# Patient Record
Sex: Female | Born: 1960 | Race: White | Hispanic: No | Smoking: Current every day smoker
Health system: Southern US, Community
[De-identification: ages and names within clinical notes are randomized; demographics above are authoritative.]

## PROBLEM LIST (undated history)

## (undated) DIAGNOSIS — I10 Essential (primary) hypertension: Secondary | ICD-10-CM

## (undated) DIAGNOSIS — I639 Cerebral infarction, unspecified: Secondary | ICD-10-CM

## (undated) DIAGNOSIS — E119 Type 2 diabetes mellitus without complications: Secondary | ICD-10-CM

---

## 2016-02-25 ENCOUNTER — Encounter (HOSPITAL_COMMUNITY): Payer: Self-pay | Admitting: *Deleted

## 2016-02-25 ENCOUNTER — Emergency Department (HOSPITAL_COMMUNITY)
Admission: EM | Admit: 2016-02-25 | Discharge: 2016-02-25 | Disposition: A | Discharge status: Home / Self Care | Payer: Medicaid - Out of State | Attending: Emergency Medicine | Admitting: Emergency Medicine

## 2016-02-25 ENCOUNTER — Emergency Department (HOSPITAL_COMMUNITY): Payer: Medicaid - Out of State

## 2016-02-25 DIAGNOSIS — I10 Essential (primary) hypertension: Secondary | ICD-10-CM | POA: Insufficient documentation

## 2016-02-25 DIAGNOSIS — E1165 Type 2 diabetes mellitus with hyperglycemia: Secondary | ICD-10-CM | POA: Insufficient documentation

## 2016-02-25 DIAGNOSIS — R109 Unspecified abdominal pain: Secondary | ICD-10-CM | POA: Diagnosis present

## 2016-02-25 DIAGNOSIS — F17210 Nicotine dependence, cigarettes, uncomplicated: Secondary | ICD-10-CM | POA: Diagnosis not present

## 2016-02-25 DIAGNOSIS — K5900 Constipation, unspecified: Secondary | ICD-10-CM | POA: Diagnosis not present

## 2016-02-25 DIAGNOSIS — R739 Hyperglycemia, unspecified: Secondary | ICD-10-CM

## 2016-02-25 DIAGNOSIS — Z79899 Other long term (current) drug therapy: Secondary | ICD-10-CM | POA: Insufficient documentation

## 2016-02-25 DIAGNOSIS — Z7984 Long term (current) use of oral hypoglycemic drugs: Secondary | ICD-10-CM | POA: Insufficient documentation

## 2016-02-25 HISTORY — DX: Essential (primary) hypertension: I10

## 2016-02-25 HISTORY — DX: Cerebral infarction, unspecified: I63.9

## 2016-02-25 HISTORY — DX: Type 2 diabetes mellitus without complications: E11.9

## 2016-02-25 LAB — COMPREHENSIVE METABOLIC PANEL
ALK PHOS: 68 U/L (ref 38–126)
ALT: 20 U/L (ref 14–54)
AST: 21 U/L (ref 15–41)
Albumin: 4.2 g/dL (ref 3.5–5.0)
Anion gap: 6 (ref 5–15)
BUN: 9 mg/dL (ref 6–20)
CALCIUM: 9 mg/dL (ref 8.9–10.3)
CO2: 25 mmol/L (ref 22–32)
CREATININE: 0.57 mg/dL (ref 0.44–1.00)
Chloride: 104 mmol/L (ref 101–111)
GFR calc non Af Amer: >60 mL/min (ref >60)
GLUCOSE: 272 mg/dL — AB (ref 65–99)
Potassium: 3.9 mmol/L (ref 3.5–5.1)
SODIUM: 135 mmol/L (ref 135–145)
Total Bilirubin: 0.6 mg/dL (ref 0.3–1.2)
Total Protein: 7.3 g/dL (ref 6.5–8.1)

## 2016-02-25 LAB — CBC
HCT: 39.7 % (ref 36.0–46.0)
Hemoglobin: 13.7 g/dL (ref 12.0–15.0)
MCH: 31 pg (ref 26.0–34.0)
MCHC: 34.5 g/dL (ref 30.0–36.0)
MCV: 89.8 fL (ref 78.0–100.0)
PLATELETS: 198 10*3/uL (ref 150–400)
RBC: 4.42 MIL/uL (ref 3.87–5.11)
RDW: 13.1 % (ref 11.5–15.5)
WBC: 10.1 10*3/uL (ref 4.0–10.5)

## 2016-02-25 LAB — URINALYSIS, ROUTINE W REFLEX MICROSCOPIC
BILIRUBIN URINE: NEGATIVE
Glucose, UA: 100 mg/dL — AB
Hgb urine dipstick: NEGATIVE
KETONES UR: NEGATIVE mg/dL
Leukocytes, UA: NEGATIVE
NITRITE: NEGATIVE
PH: 5.5 (ref 5.0–8.0)
PROTEIN: NEGATIVE mg/dL
Specific Gravity, Urine: 1.025 (ref 1.005–1.030)

## 2016-02-25 LAB — TROPONIN I: Troponin I: <0.03 ng/mL (ref <0.03)

## 2016-02-25 LAB — LIPASE, BLOOD: Lipase: 32 U/L (ref 11–51)

## 2016-02-25 LAB — D-DIMER, QUANTITATIVE (NOT AT ARMC): D DIMER QUANT: 0.31 ug{FEU}/mL (ref 0.00–0.50)

## 2016-02-25 MED ORDER — HYDROMORPHONE HCL 1 MG/ML IJ SOLN
0.5000 mg | Freq: Once | INTRAMUSCULAR | Status: AC
Start: 2016-02-25 — End: 2016-02-25
  Administered 2016-02-25: 0.5 mg via INTRAVENOUS
  Filled 2016-02-25: qty 1

## 2016-02-25 MED ORDER — KETOROLAC TROMETHAMINE 30 MG/ML IJ SOLN
15.0000 mg | Freq: Once | INTRAMUSCULAR | Status: AC
  Administered 2016-02-25: 15 mg via INTRAVENOUS
  Filled 2016-02-25: qty 1

## 2016-02-25 MED ORDER — ONDANSETRON HCL 4 MG/2ML IJ SOLN
4.0000 mg | Freq: Once | INTRAMUSCULAR | Status: AC
  Administered 2016-02-25: 4 mg via INTRAVENOUS
  Filled 2016-02-25: qty 2

## 2016-02-25 MED ORDER — HYDROCODONE-ACETAMINOPHEN 5-325 MG PO TABS
1.0000 | ORAL_TABLET | Freq: Once | ORAL | Status: AC
  Administered 2016-02-25: 1 via ORAL
  Filled 2016-02-25: qty 1

## 2016-02-25 MED ORDER — IOPAMIDOL (ISOVUE-300) INJECTION 61%
100.0000 mL | Freq: Once | INTRAVENOUS | Status: AC | PRN
  Administered 2016-02-25: 100 mL via INTRAVENOUS

## 2016-02-25 MED ORDER — POLYETHYLENE GLYCOL 3350 17 G PO PACK: 17.0000 g | PACK | Freq: Every day | ORAL | 0 refills | Status: DC

## 2016-02-25 MED ORDER — DIATRIZOATE MEGLUMINE & SODIUM 66-10 % PO SOLN
ORAL | Status: AC
  Filled 2016-02-25: qty 30

## 2016-02-25 MED ORDER — HYDROCODONE-ACETAMINOPHEN 5-325 MG PO TABS: 1.0000 | ORAL_TABLET | ORAL | 0 refills | Status: DC | PRN

## 2016-02-25 MED ORDER — HYDROMORPHONE HCL 1 MG/ML IJ SOLN
0.5000 mg | Freq: Once | INTRAMUSCULAR | Status: AC
  Administered 2016-02-25: 0.5 mg via INTRAVENOUS
  Filled 2016-02-25: qty 1

## 2016-02-25 NOTE — ED Notes (Signed)
Pt returned from xray. Nad.  

## 2016-02-25 NOTE — ED Notes (Signed)
CT drinking contrast given to pt. nad

## 2016-02-25 NOTE — ED Triage Notes (Signed)
Pt comes in for left side pain. Pt states this pain has been going on for over 2 months and that [facility] with no answer to her pain. Pt denies any n/v/d, denies any urinary symptoms.

## 2016-02-25 NOTE — ED Provider Notes (Signed)
[facility]-EMERGENCY DEPT Provider Note   CSN: [id number] Arrival date & time: 02/25/16  1529  First Provider Contact:  First MD Initiated Contact with Patient 02/25/16 1607        History   Chief Complaint Chief Complaint  Patient presents with  . Flank Pain    HPI Kim Boyd is a 55 y.o. female.  Kim Boyd is a 55 y.o. 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  Associated symptoms include increasing constipation with increased flatulence despite using stool softeners at her pcp's suggestion.  She endorses nausea without emesis, denies fevers, chills, chest pain, sob, dysuria, hematuria or bloody stools.  She has been seen both at [facility] and in [place] (reports had a negative CT scan there about 3 weeks ago) all without diagnosis.  She endorses prior history of heavy etoh use, not currently.  She has developed a new constant pain between her shoulder blades this week which is worsened with deep inspiration.  She reports chronic smokers cough, not new or different and denies sob.  She has found no alleviators for her symptoms.  She reports her pcp ([facility] [place]) wants her to get a colonoscopy which has been scheduled at [facility] but pt denies ability to get transportation there.     The history is provided by the patient and a relative.    Past Medical History:  Diagnosis Date  . Diabetes mellitus without complication (HCC)   . Hypertension   . Stroke [facility])    April 2017    There are no active problems to display for this patient.   Past Surgical History:  Procedure Laterality Date  . CESAREAN SECTION      OB History    No data available       Home Medications    Prior to Admission medications   Medication Sig  Start Date End Date Taking? Authorizing Provider  LANTUS 100 UNIT/ML injection Inject 20 Units into the skin daily. 01/14/16  Yes Historical Provider, MD  ciprofloxacin (CIPRO) 500 MG tablet Take 500 mg by mouth 2 (two) times daily. Starting 01/28/2016 x 10 days for pain in side.    Historical Provider, MD  HYDROcodone-acetaminophen (NORCO/VICODIN) 5-325 MG tablet Take 1 tablet by mouth every 4 (four) hours as needed. 02/25/16   Burgess Amor, PA-C  polyethylene glycol (MIRALAX / GLYCOLAX) packet Take 17 g by mouth daily. 02/25/16   Burgess Amor, PA-C    Family History No family history on file.  Social History Social History  Substance Use Topics  . Smoking status: Current Every Day Smoker    Packs/day: 0.50    Types: Cigarettes  . Smokeless tobacco: Never Used  . Alcohol use Yes     Comment: occ.      Allergies   Review of patient's allergies indicates no known allergies.   Review of Systems Review of Systems  Constitutional: Negative for fever.  HENT: Negative for congestion and sore throat.   Eyes: Negative.   Respiratory: Positive for cough. Negative for chest tightness and shortness of breath.        Baseline smokers cough without change  Cardiovascular: Negative for chest pain.  Gastrointestinal: Positive for abdominal pain, constipation and nausea. Negative for blood in stool and vomiting.  Genitourinary: Negative.  Negative for hematuria.  Musculoskeletal: Positive for back pain. Negative for arthralgias, joint swelling and neck pain.  Skin: Negative.  Negative for rash and wound.  Neurological: Negative for dizziness, weakness, light-headedness, numbness and headaches.  Psychiatric/Behavioral: Negative.      Physical Exam Updated Vital Signs BP 161/78 (BP Location: Right Arm)   Pulse 68   Temp 98 F (36.7 C) (Oral)   Resp 18   Ht 5\' 1"  (1.549 m)   Wt 56.7 kg   SpO2 97%   BMI 23.62 kg/m   Physical Exam  Constitutional: She appears well-developed and  well-nourished.  HENT:  Head: Normocephalic and atraumatic.  Eyes: Conjunctivae are normal.  Neck: Normal range of motion.  Cardiovascular: Normal rate, regular rhythm, normal heart sounds and intact distal pulses.   Pulmonary/Chest: Effort normal and breath sounds normal. She has no wheezes. She exhibits no tenderness.  Abdominal: Soft. Bowel sounds are normal. She exhibits no distension and no mass. There is no hepatosplenomegaly. There is tenderness. There is CVA tenderness. There is no rigidity, no rebound and no guarding. No hernia.  ttp left lateral mid abdomen and left cva tenderness. Positive left cva ttp.  Musculoskeletal: Normal range of motion.  Neurological: She is alert.  Skin: Skin is warm and dry.  Psychiatric: She has a normal mood and affect.  Nursing note and vitals reviewed.    ED Treatments / Results  Labs (all labs ordered are listed, but only abnormal results are displayed) Labs Reviewed  URINALYSIS, ROUTINE W REFLEX MICROSCOPIC (NOT AT [facility]) - Abnormal; Notable for the following:       Result Value   Glucose, UA 100 (*)    All other components within normal limits  COMPREHENSIVE METABOLIC PANEL - Abnormal; Notable for the following:    Glucose, Bld 272 (*)    All other components within normal limits  LIPASE, BLOOD  CBC  D-DIMER, QUANTITATIVE (NOT AT [facility])  TROPONIN I    EKG  EKG Interpretation  Date/Time:  Wednesday February 25 2016 16:36:31 EDT Ventricular Rate:  65 PR Interval:  176 QRS Duration: 90 QT Interval:  410 QTC Calculation: 426 R Axis:   86 Text Interpretation:  Normal sinus rhythm Normal ECG Confirmed by HAVILAND MD, Mikolaj Woolstenhulme ([id number]) on 02/25/2016 4:43:00 PM       Radiology Ct Abdomen Pelvis W Contrast  Result Date: 02/25/2016 CLINICAL DATA:  55 year old female with left abdominal and pelvic pain, nausea and constipation for 2 months. EXAM: CT ABDOMEN AND PELVIS WITH CONTRAST TECHNIQUE: Multidetector CT imaging of the abdomen and pelvis  was performed using the standard protocol following bolus administration of intravenous contrast. CONTRAST:  100mL ISOVUE-300 IOPAMIDOL (ISOVUE-300) INJECTION 61% COMPARISON:  None. FINDINGS: Lower chest:  Unremarkable Hepatobiliary: The liver and gallbladder are unremarkable. There is no evidence of biliary dilatation. Pancreas: A 1.5 cm dense calcification within the body of the pancreas noted with atrophy of the pancreatic body and tail. Spleen: Unremarkable Adrenals/Urinary Tract: The kidneys, adrenal glands and bladder are unremarkable. Stomach/Bowel: The appendix is upper limits of normal in size with equivocal mild adjacent stranding. No other bowel abnormalities are identified. There is no evidence of bowel obstruction or definite bowel wall thickening otherwise. Vascular/Lymphatic: Abdominal aortic atherosclerotic calcifications noted without aneurysm. No enlarged lymph nodes identified. Reproductive: Unremarkable Other: No free fluid, pneumoperitoneum or abscess. Musculoskeletal: No acute or suspicious abnormality. IMPRESSION: Upper limits normal sized appendix with equivocal mild adjacent stranding. Correlate clinically as early/mild appendicitis is not  entirely excluded. Abdominal aortic atherosclerosis. Benign-appearing calcification within the body of the pancreas with atrophic pancreatic tail. Electronically Signed   By: Harmon Pier M.D.   On: 02/25/2016 19:54   Dg Abd Acute W/chest  Result Date: 02/25/2016 CLINICAL DATA:  Pt comes in for left side pain. Pt states this pain has been going on for over 2 months and that she has been evaluated at [facility] and [facility] with no answer to her pain. Pt denies any n/v/d, denies any urinary symptoms. EXAM: DG ABDOMEN ACUTE W/ 1V CHEST COMPARISON:  None. FINDINGS: Cardiomediastinal silhouette is within normal limits. The lungs are free of focal consolidations and pleural effusions. No free intraperitoneal air. Supine and erect views show  moderate stool burden throughout nondilated loops of colon. There is mild prominence of small bowel loops in the left upper quadrant. No evidence for bowel obstruction, organomegaly. Small phleboliths are identified within the pelvis. IMPRESSION: 1.  No evidence for acute cardiopulmonary abnormality. 2. Moderate stool burden. 3. Mildly prominent left upper quadrant small bowel loops possibly indicating focal ileus. Electronically Signed   By: Norva Pavlov M.D.   On: 02/25/2016 17:06    Procedures Procedures (including critical care time)  Medications Ordered in ED Medications  ondansetron (ZOFRAN) injection 4 mg (4 mg Intravenous Given 02/25/16 1644)  ketorolac (TORADOL) 30 MG/ML injection 15 mg (15 mg Intravenous Given 02/25/16 1644)  HYDROmorphone (DILAUDID) injection 0.5 mg (0.5 mg Intravenous Given 02/25/16 1730)  HYDROmorphone (DILAUDID) injection 0.5 mg (0.5 mg Intravenous Given 02/25/16 1915)  iopamidol (ISOVUE-300) 61 % injection 100 mL (100 mLs Intravenous Contrast Given 02/25/16 1932)  HYDROcodone-acetaminophen (NORCO/VICODIN) 5-325 MG per tablet 1 tablet (1 tablet Oral Given 02/25/16 2156)     Initial Impression / Assessment and Plan / ED Course  I have reviewed the triage vital signs and the nursing notes.  Pertinent labs & imaging results that were available during my care of the patient were reviewed by me and considered in my medical decision making (see chart for details).  Clinical Course    Records from [facility] ED obtained and reviewed including CT scan - renal stone protocol and negative. Labs and CT imaging reviewed.  Pt re-examined with no rlq pain. Her pain has been chronic for 2+ months and very atypical for appendicitis despite CT findings.  She does have significant constipation and was prescribed miralax.  Discussed case with Dr. Manus Gunning who also saw pt and with Dr. Lovell Sheehan who agrees to see pt in office in close f/u.  Pt advised to call his office for appt time.  She  and dg agree with plan.  She was given script for a few hydrocodone tabs but cautioned to use sparingly as these can increase constipation.   Final Clinical Impressions(s) / ED Diagnoses   Final diagnoses:  Flank pain  Hyperglycemia  Constipation, unspecified constipation type    New Prescriptions Discharge Medication List as of 02/25/2016  9:39 PM    START taking these medications   Details  HYDROcodone-acetaminophen (NORCO/VICODIN) 5-325 MG tablet Take 1 tablet by mouth every 4 (four) hours as needed., Starting Wed 02/25/2016, Print    polyethylene glycol (MIRALAX / GLYCOLAX) packet Take 17 g by mouth daily., Starting Wed 02/25/2016, Print         Burgess Amor, PA-C 02/26/16 1311    Glynn Octave, MD 02/26/16 [phone]

## 2016-02-25 NOTE — Discharge Instructions (Signed)
You may take the hydrocodone prescribed for pain relief.  This will make you drowsy - do not drive within 4 hours of taking this medication.  Also it can make you more constipated so use sparingly.  Add the miralax powder daily to help resolve your constipation.

## 2020-06-16 ENCOUNTER — Emergency Department (HOSPITAL_COMMUNITY): Payer: 59

## 2020-06-16 ENCOUNTER — Encounter (HOSPITAL_COMMUNITY): Payer: Self-pay | Admitting: Emergency Medicine

## 2020-06-16 ENCOUNTER — Other Ambulatory Visit: Payer: Self-pay

## 2020-06-16 ENCOUNTER — Inpatient Hospital Stay (HOSPITAL_COMMUNITY)
Admission: EM | Admit: 2020-06-16 | Discharge: 2020-06-19 | DRG: 917 | Disposition: A | Discharge status: Home Health | Payer: 59 | Attending: Internal Medicine | Admitting: Internal Medicine

## 2020-06-16 DIAGNOSIS — I6930 Unspecified sequelae of cerebral infarction: Secondary | ICD-10-CM

## 2020-06-16 DIAGNOSIS — Z20822 Contact with and (suspected) exposure to covid-19: Secondary | ICD-10-CM | POA: Diagnosis present

## 2020-06-16 DIAGNOSIS — I63212 Cerebral infarction due to unspecified occlusion or stenosis of left vertebral arteries: Secondary | ICD-10-CM | POA: Diagnosis present

## 2020-06-16 DIAGNOSIS — Z79899 Other long term (current) drug therapy: Secondary | ICD-10-CM

## 2020-06-16 DIAGNOSIS — T405X1A Poisoning by cocaine, accidental (unintentional), initial encounter: Principal | ICD-10-CM | POA: Diagnosis present

## 2020-06-16 DIAGNOSIS — Z8673 Personal history of transient ischemic attack (TIA), and cerebral infarction without residual deficits: Secondary | ICD-10-CM

## 2020-06-16 DIAGNOSIS — Z809 Family history of malignant neoplasm, unspecified: Secondary | ICD-10-CM

## 2020-06-16 DIAGNOSIS — G8321 Monoplegia of upper limb affecting right dominant side: Secondary | ICD-10-CM | POA: Diagnosis present

## 2020-06-16 DIAGNOSIS — F17210 Nicotine dependence, cigarettes, uncomplicated: Secondary | ICD-10-CM | POA: Diagnosis present

## 2020-06-16 DIAGNOSIS — F1910 Other psychoactive substance abuse, uncomplicated: Secondary | ICD-10-CM

## 2020-06-16 DIAGNOSIS — E1165 Type 2 diabetes mellitus with hyperglycemia: Secondary | ICD-10-CM

## 2020-06-16 DIAGNOSIS — R200 Anesthesia of skin: Secondary | ICD-10-CM | POA: Diagnosis not present

## 2020-06-16 DIAGNOSIS — R7309 Other abnormal glucose: Secondary | ICD-10-CM

## 2020-06-16 DIAGNOSIS — I10 Essential (primary) hypertension: Secondary | ICD-10-CM | POA: Diagnosis present

## 2020-06-16 DIAGNOSIS — Z7982 Long term (current) use of aspirin: Secondary | ICD-10-CM

## 2020-06-16 DIAGNOSIS — E785 Hyperlipidemia, unspecified: Secondary | ICD-10-CM | POA: Diagnosis present

## 2020-06-16 DIAGNOSIS — R29701 NIHSS score 1: Secondary | ICD-10-CM | POA: Diagnosis present

## 2020-06-16 DIAGNOSIS — I639 Cerebral infarction, unspecified: Secondary | ICD-10-CM | POA: Diagnosis present

## 2020-06-16 DIAGNOSIS — I6381 Other cerebral infarction due to occlusion or stenosis of small artery: Secondary | ICD-10-CM | POA: Diagnosis present

## 2020-06-16 DIAGNOSIS — R739 Hyperglycemia, unspecified: Secondary | ICD-10-CM

## 2020-06-16 DIAGNOSIS — E1142 Type 2 diabetes mellitus with diabetic polyneuropathy: Secondary | ICD-10-CM | POA: Diagnosis present

## 2020-06-16 DIAGNOSIS — Z794 Long term (current) use of insulin: Secondary | ICD-10-CM

## 2020-06-16 LAB — I-STAT CHEM 8, ED
BUN: 17 mg/dL (ref 6–20)
Calcium, Ion: 1.22 mmol/L (ref 1.15–1.40)
Chloride: 99 mmol/L (ref 98–111)
Creatinine, Ser: 0.6 mg/dL (ref 0.44–1.00)
Glucose, Bld: 445 mg/dL — ABNORMAL HIGH (ref 70–99)
HCT: 48 % — ABNORMAL HIGH (ref 36.0–46.0)
Hemoglobin: 16.3 g/dL — ABNORMAL HIGH (ref 12.0–15.0)
Potassium: 3.9 mmol/L (ref 3.5–5.1)
Sodium: 136 mmol/L (ref 135–145)
TCO2: 25 mmol/L (ref 22–32)

## 2020-06-16 LAB — COMPREHENSIVE METABOLIC PANEL
ALT: 18 U/L (ref 0–44)
AST: 25 U/L (ref 15–41)
Albumin: 3.7 g/dL (ref 3.5–5.0)
Alkaline Phosphatase: 77 U/L (ref 38–126)
Anion gap: 12 (ref 5–15)
BUN: 14 mg/dL (ref 6–20)
CO2: 20 mmol/L — ABNORMAL LOW (ref 22–32)
Calcium: 9.2 mg/dL (ref 8.9–10.3)
Chloride: 99 mmol/L (ref 98–111)
Creatinine, Ser: 0.94 mg/dL (ref 0.44–1.00)
GFR, Estimated: >60 mL/min (ref >60)
Glucose, Bld: 530 mg/dL (ref 70–99)
Potassium: 3.9 mmol/L (ref 3.5–5.1)
Sodium: 131 mmol/L — ABNORMAL LOW (ref 135–145)
Total Bilirubin: 0.5 mg/dL (ref 0.3–1.2)
Total Protein: 6.6 g/dL (ref 6.5–8.1)

## 2020-06-16 LAB — CBC WITH DIFFERENTIAL/PLATELET
Abs Immature Granulocytes: 0.03 10*3/uL (ref 0.00–0.07)
Basophils Absolute: 0.1 10*3/uL (ref 0.0–0.1)
Basophils Relative: 1 %
Eosinophils Absolute: 0.1 10*3/uL (ref 0.0–0.5)
Eosinophils Relative: 1 %
HCT: 45.1 % (ref 36.0–46.0)
Hemoglobin: 14.7 g/dL (ref 12.0–15.0)
Immature Granulocytes: 0 %
Lymphocytes Relative: 19 %
Lymphs Abs: 1.8 10*3/uL (ref 0.7–4.0)
MCH: 29.6 pg (ref 26.0–34.0)
MCHC: 32.6 g/dL (ref 30.0–36.0)
MCV: 90.7 fL (ref 80.0–100.0)
Monocytes Absolute: 0.4 10*3/uL (ref 0.1–1.0)
Monocytes Relative: 4 %
Neutro Abs: 7.3 10*3/uL (ref 1.7–7.7)
Neutrophils Relative %: 75 %
Platelets: 213 10*3/uL (ref 150–400)
RBC: 4.97 MIL/uL (ref 3.87–5.11)
RDW: 13.3 % (ref 11.5–15.5)
WBC: 9.7 10*3/uL (ref 4.0–10.5)
nRBC: 0 % (ref 0.0–0.2)

## 2020-06-16 LAB — APTT: aPTT: 26 seconds (ref 24–36)

## 2020-06-16 LAB — RESP PANEL BY RT-PCR (FLU A&B, COVID) ARPGX2
Influenza A by PCR: NEGATIVE
Influenza B by PCR: NEGATIVE
SARS Coronavirus 2 by RT PCR: NEGATIVE

## 2020-06-16 LAB — I-STAT BETA HCG BLOOD, ED (MC, WL, AP ONLY): I-stat hCG, quantitative: 13.9 m[IU]/mL — ABNORMAL HIGH (ref <5)

## 2020-06-16 LAB — PROTIME-INR
INR: 1 (ref 0.8–1.2)
Prothrombin Time: 12.3 seconds (ref 11.4–15.2)

## 2020-06-16 MED ORDER — GADOBUTROL 1 MMOL/ML IV SOLN
5.5000 mL | Freq: Once | INTRAVENOUS | Status: AC | PRN
  Administered 2020-06-16: 5.5 mL via INTRAVENOUS

## 2020-06-16 MED ORDER — SODIUM CHLORIDE 0.9 % IV SOLN
100.0000 mL/h | INTRAVENOUS | Status: DC
  Administered 2020-06-16: 100 mL/h via INTRAVENOUS

## 2020-06-16 MED ORDER — INSULIN ASPART 100 UNIT/ML ~~LOC~~ SOLN
10.0000 [IU] | Freq: Once | SUBCUTANEOUS | Status: AC
  Administered 2020-06-16: 10 [IU] via INTRAVENOUS

## 2020-06-16 MED ORDER — SODIUM CHLORIDE 0.9 % IV BOLUS
500.0000 mL | Freq: Once | INTRAVENOUS | Status: AC
  Administered 2020-06-16: 500 mL via INTRAVENOUS

## 2020-06-16 NOTE — ED Notes (Signed)
Pt to mri 

## 2020-06-16 NOTE — ED Provider Notes (Signed)
[facility] EMERGENCY DEPARTMENT Provider Note   CSN: [id number] Arrival date & time: 06/16/20  1825     History Chief Complaint  Patient presents with  . Numbness    Kim Boyd is a 59 y.o. female.  HPI Patient presents with right-sided numbness.  Onset was yesterday, about 34 hours prior to ED arrival.  She notes history of hypertension, and cigarette addiction. However, she was in her usual state of health until yesterday.  Since onset yesterday, she has had persistent sense of numbness throughout her right upper and lower extremities.  Less severe symptoms are present in her face.  No difficulty swallowing, speaking, breathing. Mild discoordination on the right. Since onset, no clear alleviating or exacerbating factors. It is unclear why she waited until today for evaluation.   Past Medical History:  Diagnosis Date  . Diabetes mellitus without complication (HCC)   . Hypertension   . Stroke [facility])    April 2017    There are no problems to display for this patient.   Past Surgical History:  Procedure Laterality Date  . CESAREAN SECTION       OB History   No obstetric history on file.     No family history on file.  Social History   Tobacco Use  . Smoking status: Current Every Day Smoker    Packs/day: 0.50    Types: Cigarettes  . Smokeless tobacco: Never Used  Substance Use Topics  . Alcohol use: Yes    Comment: occ.   . Drug use: Yes    Comment: "street drugs" pt wouldnt' be more descriptive.     Home Medications Prior to Admission medications   Medication Sig Start Date End Date Taking? Authorizing Provider  amitriptyline (ELAVIL) 25 MG tablet Take 1 tablet by mouth daily. 02/29/20  Yes [provider]  aspirin 81 MG EC tablet Take 1 tablet by mouth daily.   Yes [provider]  gabapentin (NEURONTIN) 300 MG capsule Take 300 mg by mouth 3 (three) times daily. 04/30/20  Yes [provider]    gabapentin (NEURONTIN) 600 MG tablet Take 600 mg by mouth 3 (three) times daily. 01/11/20  Yes [provider]  glipiZIDE (GLUCOTROL) 5 MG tablet Take 5 mg by mouth 2 (two) times daily. 05/22/20  Yes [provider]  hydrochlorothiazide (MICROZIDE) 12.5 MG capsule Take 12.5 mg by mouth daily. 05/07/20  Yes [provider]  LANTUS 100 UNIT/ML injection Inject 20 Units into the skin daily. 01/14/16  Yes [provider]  levocetirizine (XYZAL) 5 MG tablet Take 5 mg by mouth daily. 03/01/20  Yes [provider]  lisinopril (ZESTRIL) 20 MG tablet Take 20 mg by mouth daily. 05/07/20  Yes [provider]  rosuvastatin (CRESTOR) 10 MG tablet Take 10 mg by mouth daily. 04/30/20  Yes [provider]  JANUMET 50-1000 MG tablet Take 1 tablet by mouth 2 (two) times daily. Patient not taking: Reported on 06/16/2020 04/30/20   [provider]    Allergies    Other  Review of Systems   Review of Systems  Constitutional:       Per HPI, otherwise negative  HENT:       Per HPI, otherwise negative  Respiratory:       Per HPI, otherwise negative  Cardiovascular:       Per HPI, otherwise negative  Gastrointestinal: Negative for vomiting.  Endocrine:       Negative aside from HPI  Genitourinary:  Neg aside from HPI   Musculoskeletal:       Per HPI, otherwise negative  Skin: Negative.   Neurological: Positive for weakness and numbness. Negative for syncope.    Physical Exam Updated Vital Signs BP (!) 120/97 (BP Location: Right Arm)   Pulse 71   Temp 98.5 F (36.9 C) (Oral)   Resp (!) 22   Ht 5\' 1"  (1.549 m)   Wt 56.7 kg   SpO2 100%   BMI 23.62 kg/m   Physical Exam Vitals and nursing note reviewed.  Constitutional:      General: She is not in acute distress.    Appearance: She is well-developed.  HENT:     Head: Normocephalic and atraumatic.  Eyes:     Conjunctiva/sclera: Conjunctivae normal.  Cardiovascular:      Rate and Rhythm: Normal rate and regular rhythm.  Pulmonary:     Effort: Pulmonary effort is normal. No respiratory distress.     Breath sounds: Normal breath sounds. No stridor.  Abdominal:     General: There is no distension.  Skin:    General: Skin is warm and dry.  Neurological:     Mental Status: She is alert and oriented to person, place, and time.     Cranial Nerves: No cranial nerve deficit or dysarthria.     Motor: Weakness present.     Coordination: Coordination abnormal.     Comments: Dysmetria, right-sided. Right grip strength is 4+/5, left grip 5/5.  Otherwise neurological exam unremarkable, no facial asymmetry, speech is brief, clear.  Patient does describe subjective diminished sensation in the extremities right compared to left.      ED Results / Procedures / Treatments   Labs (all labs ordered are listed, but only abnormal results are displayed) Labs Reviewed  COMPREHENSIVE METABOLIC PANEL - Abnormal; Notable for the following components:      Result Value   Sodium 131 (*)    CO2 20 (*)    Glucose, Bld 530 (*)    All other components within normal limits  I-STAT CHEM 8, ED - Abnormal; Notable for the following components:   Glucose, Bld 445 (*)    Hemoglobin 16.3 (*)    HCT 48.0 (*)    All other components within normal limits  I-STAT BETA HCG BLOOD, ED ([facility], [facility], [facility] ONLY) - Abnormal; Notable for the following components:   I-stat hCG, quantitative 13.9 (*)    All other components within normal limits  RESP PANEL BY RT-PCR (FLU A&B, COVID) ARPGX2  CBC WITH DIFFERENTIAL/PLATELET  PROTIME-INR  APTT  RAPID URINE DRUG SCREEN, HOSP PERFORMED  URINALYSIS, ROUTINE W REFLEX MICROSCOPIC    EKG EKG Interpretation  Date/Time:  Monday June 16 2020 18:29:43 EST Ventricular Rate:  83 PR Interval:  174 QRS Duration: 90 QT Interval:  376 QTC Calculation: 441 R Axis:   114 Text Interpretation: Normal sinus rhythm Right axis deviation Septal infarct , age  undetermined Abnormal ECG no sig changes since last EKG Confirmed by Eber HongMiller, Brian ([id number]) on 06/16/2020 7:33:56 PM   Radiology CT Head Wo Contrast  Result Date: 06/16/2020 CLINICAL DATA:  59 year old female with numbness, tingling, and paresthesia. EXAM: CT HEAD WITHOUT CONTRAST TECHNIQUE: Contiguous axial images were obtained from the base of the skull through the vertex without intravenous contrast. COMPARISON:  None. FINDINGS: Brain: The ventricles and sulci appropriate size for patient's age. There is a 1.3 x 0.9 cm hypodense area in the left thalamus consistent with an infarct, likely acute  or subacute. Clinical correlation and further evaluation with MRI is recommended. 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 There is no acute intracranial hemorrhage. No mass effect or midline shift. No extra-axial fluid collection. Vascular: No hyperdense vessel or unexpected calcification. Skull: Normal. Negative for fracture or focal lesion. Sinuses/Orbits: No acute finding. Other: None IMPRESSION: 1. Left thalamic infarct, likely acute or subacute. 2. No acute intracranial hemorrhage. 3. 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 These results were called by telephone at the time of interpretation on 06/16/2020 at 8:21 pm to provider [facility] , who verbally acknowledged these results. Electronically Signed   By: Elgie Collard M.D.   On: 06/16/2020 20:28   MR ANGIO HEAD WO CONTRAST  Result Date: 06/16/2020 CLINICAL DATA:  Acute neurologic deficit EXAM: MR HEAD WITHOUT CONTRAST MR CIRCLE OF WILLIS WITHOUT CONTRAST MRA OF THE NECK WITHOUT AND WITH CONTRAST TECHNIQUE: Multiplanar, multiecho pulse sequences of the brain, circle of willis and surrounding structures were obtained without intravenous contrast. Angiographic images of the neck were obtained using MRA technique without and with intravenous contrast. CONTRAST:  5.58mL GADAVIST GADOBUTROL 1 MMOL/ML IV SOLN COMPARISON:  None.  FINDINGS: MR HEAD FINDINGS Brain: Small focus of acute ischemia in the left thalamus. Multifocal hyperintense T2-weighted signal within the white matter. There is an old right occipital cortical infarct. Multiple old corona radiata small vessel infarcts. Normal volume of CSF spaces. No chronic microhemorrhage. Normal midline structures. Vascular: Major flow voids are preserved. Skull and upper cervical spine: Normal calvarium and skull base. Visualized upper cervical spine and soft tissues are normal. Sinuses/Orbits:No paranasal sinus fluid levels or advanced mucosal thickening. No mastoid or middle ear effusion. Normal orbits. MR CIRCLE OF WILLIS FINDINGS POSTERIOR CIRCULATION: --Vertebral arteries: Normal --Inferior cerebellar arteries: Normal. --Basilar artery: Normal. --Superior cerebellar arteries: Normal. --Posterior cerebral arteries: Normal. ANTERIOR CIRCULATION: --Intracranial internal carotid arteries: Normal. --Anterior cerebral arteries (ACA): Normal. --Middle cerebral arteries (MCA): Normal. ANATOMIC VARIANTS: Fetal origin of the left PCA. Both P comm's are present. MRA NECK FINDINGS There is an aberrant right subclavian artery. The visualized subclavian arteries are both patent. There is no carotid artery stenosis. The vertebral arteries are left-dominant. Both origins are clearly patent. There is no stenosis or other abnormality. IMPRESSION: 1. Small focus of acute ischemia in the left thalamus. No hemorrhage or mass effect. 2. Old right occipital cortical infarct and multiple old corona radiata small vessel infarcts. 3. Normal MRA of the head and neck. Electronically Signed   By: Deatra Robinson M.D.   On: 06/16/2020 22:49   MR ANGIO NECK W WO CONTRAST  Result Date: 06/16/2020 CLINICAL DATA:  Acute neurologic deficit EXAM: MR HEAD WITHOUT CONTRAST MR CIRCLE OF WILLIS WITHOUT CONTRAST MRA OF THE NECK WITHOUT AND WITH CONTRAST TECHNIQUE: Multiplanar, multiecho pulse sequences of the brain, circle of  willis and surrounding structures were obtained without intravenous contrast. Angiographic images of the neck were obtained using MRA technique without and with intravenous contrast. CONTRAST:  5.74mL GADAVIST GADOBUTROL 1 MMOL/ML IV SOLN COMPARISON:  None. FINDINGS: MR HEAD FINDINGS Brain: Small focus of acute ischemia in the left thalamus. Multifocal hyperintense T2-weighted signal within the white matter. There is an old right occipital cortical infarct. Multiple old corona radiata small vessel infarcts. Normal volume of CSF spaces. No chronic microhemorrhage. Normal midline structures. Vascular: Major flow voids are preserved. Skull and upper cervical spine: Normal calvarium and skull base. Visualized upper cervical spine and soft tissues are normal. Sinuses/Orbits:No paranasal sinus fluid levels or advanced mucosal  thickening. No mastoid or middle ear effusion. Normal orbits. MR CIRCLE OF WILLIS FINDINGS POSTERIOR CIRCULATION: --Vertebral arteries: Normal --Inferior cerebellar arteries: Normal. --Basilar artery: Normal. --Superior cerebellar arteries: Normal. --Posterior cerebral arteries: Normal. ANTERIOR CIRCULATION: --Intracranial internal carotid arteries: Normal. --Anterior cerebral arteries (ACA): Normal. --Middle cerebral arteries (MCA): Normal. ANATOMIC VARIANTS: Fetal origin of the left PCA. Both P comm's are present. MRA NECK FINDINGS There is an aberrant right subclavian artery. The visualized subclavian arteries are both patent. There is no carotid artery stenosis. The vertebral arteries are left-dominant. Both origins are clearly patent. There is no stenosis or other abnormality. IMPRESSION: 1. Small focus of acute ischemia in the left thalamus. No hemorrhage or mass effect. 2. Old right occipital cortical infarct and multiple old corona radiata small vessel infarcts. 3. Normal MRA of the head and neck. Electronically Signed   By: Deatra Robinson M.D.   On: 06/16/2020 22:49   MR BRAIN WO  CONTRAST  Result Date: 06/16/2020 CLINICAL DATA:  Acute neurologic deficit EXAM: MR HEAD WITHOUT CONTRAST MR CIRCLE OF WILLIS WITHOUT CONTRAST MRA OF THE NECK WITHOUT AND WITH CONTRAST TECHNIQUE: Multiplanar, multiecho pulse sequences of the brain, circle of willis and surrounding structures were obtained without intravenous contrast. Angiographic images of the neck were obtained using MRA technique without and with intravenous contrast. CONTRAST:  5.70mL GADAVIST GADOBUTROL 1 MMOL/ML IV SOLN COMPARISON:  None. FINDINGS: MR HEAD FINDINGS Brain: Small focus of acute ischemia in the left thalamus. Multifocal hyperintense T2-weighted signal within the white matter. There is an old right occipital cortical infarct. Multiple old corona radiata small vessel infarcts. Normal volume of CSF spaces. No chronic microhemorrhage. Normal midline structures. Vascular: Major flow voids are preserved. Skull and upper cervical spine: Normal calvarium and skull base. Visualized upper cervical spine and soft tissues are normal. Sinuses/Orbits:No paranasal sinus fluid levels or advanced mucosal thickening. No mastoid or middle ear effusion. Normal orbits. MR CIRCLE OF WILLIS FINDINGS POSTERIOR CIRCULATION: --Vertebral arteries: Normal --Inferior cerebellar arteries: Normal. --Basilar artery: Normal. --Superior cerebellar arteries: Normal. --Posterior cerebral arteries: Normal. ANTERIOR CIRCULATION: --Intracranial internal carotid arteries: Normal. --Anterior cerebral arteries (ACA): Normal. --Middle cerebral arteries (MCA): Normal. ANATOMIC VARIANTS: Fetal origin of the left PCA. Both P comm's are present. MRA NECK FINDINGS There is an aberrant right subclavian artery. The visualized subclavian arteries are both patent. There is no carotid artery stenosis. The vertebral arteries are left-dominant. Both origins are clearly patent. There is no stenosis or other abnormality. IMPRESSION: 1. Small focus of acute ischemia in the left  thalamus. No hemorrhage or mass effect. 2. Old right occipital cortical infarct and multiple old corona radiata small vessel infarcts. 3. Normal MRA of the head and neck. Electronically Signed   By: Deatra Robinson M.D.   On: 06/16/2020 22:49    Procedures Procedures (including critical care time)  Medications Ordered in ED Medications  sodium chloride 0.9 % bolus 500 mL (500 mLs Intravenous New Bag/Given 06/16/20 2250)    Followed by  0.9 %  sodium chloride infusion (has no administration in time range)  insulin aspart (novoLOG) injection 10 Units (10 Units Intravenous Given 06/16/20 2245)  gadobutrol (GADAVIST) 1 MMOL/ML injection 5.5 mL (5.5 mLs Intravenous Contrast Given 06/16/20 2237)    ED Course  I have reviewed the triage vital signs and the nursing notes.  Pertinent labs & imaging results that were available during my care of the patient were reviewed by me and considered in my medical decision making (see chart for details).  After my initial evaluation I discussed her case with our neurology colleagues.  Line given concern for both her new right-sided numbness and hyperglycemia, she had additional studies of MRI, and interventions including insulin, fluids.   Update:, MRI results discussed, reviewed, notable for findings consistent with CT abnormality of a likely thalamic infarct. Given the acute nature of this injury, the onset of symptoms yesterday, she will require admission for ongoing monitoring management of her stroke. Glucose has begun to reduce after initial insulin provided in the ED, will require additional management as well.  No e/o DKA. Neurology and internal medicine aware of the patient for admission.  MDM Rules/Calculators/A&P MDM Number of Diagnoses or Management Options Hyperglycemia: new, needed workup Thalamic infarct, acute (HCC): new, needed workup   Amount and/or Complexity of Data Reviewed Clinical lab tests: reviewed Tests in the radiology  section of CPT: reviewed Tests in the medicine section of CPT: reviewed Decide to obtain previous medical records or to obtain history from someone other than the patient: yes Obtain history from someone other than the patient: yes Review and summarize past medical records: yes Discuss the patient with other providers: yes Independent visualization of images, tracings, or specimens: yes  Risk of Complications, Morbidity, and/or Mortality Presenting problems: high Diagnostic procedures: high Management options: high  Critical Care Total time providing critical care: < 30 minutes  Patient Progress Patient progress: stable  Final Clinical Impression(s) / ED Diagnoses Final diagnoses:  Thalamic infarct, acute (HCC)  Hyperglycemia     Gerhard Munch, MD 06/16/20 2307

## 2020-06-16 NOTE — ED Triage Notes (Signed)
Pt c/o right side numbness since yesterday morning getting worse today. Pt states she has prior hx of stroke. Pt is AO x 4 on triage no neuro deficit noticed during triage.

## 2020-06-16 NOTE — ED Notes (Signed)
Date and time results received: 06/16/20   Test: Glucose Critical Value: 530  Name of Provider Notified: [place]

## 2020-06-17 ENCOUNTER — Observation Stay (HOSPITAL_BASED_OUTPATIENT_CLINIC_OR_DEPARTMENT_OTHER): Payer: 59

## 2020-06-17 DIAGNOSIS — R29701 NIHSS score 1: Secondary | ICD-10-CM | POA: Diagnosis present

## 2020-06-17 DIAGNOSIS — R7309 Other abnormal glucose: Secondary | ICD-10-CM | POA: Diagnosis not present

## 2020-06-17 DIAGNOSIS — I10 Essential (primary) hypertension: Secondary | ICD-10-CM | POA: Diagnosis present

## 2020-06-17 DIAGNOSIS — E1165 Type 2 diabetes mellitus with hyperglycemia: Secondary | ICD-10-CM | POA: Diagnosis present

## 2020-06-17 DIAGNOSIS — F1910 Other psychoactive substance abuse, uncomplicated: Secondary | ICD-10-CM | POA: Diagnosis not present

## 2020-06-17 DIAGNOSIS — Z7982 Long term (current) use of aspirin: Secondary | ICD-10-CM | POA: Diagnosis not present

## 2020-06-17 DIAGNOSIS — Z794 Long term (current) use of insulin: Secondary | ICD-10-CM | POA: Diagnosis not present

## 2020-06-17 DIAGNOSIS — T405X1A Poisoning by cocaine, accidental (unintentional), initial encounter: Secondary | ICD-10-CM | POA: Diagnosis present

## 2020-06-17 DIAGNOSIS — F17210 Nicotine dependence, cigarettes, uncomplicated: Secondary | ICD-10-CM | POA: Diagnosis present

## 2020-06-17 DIAGNOSIS — E785 Hyperlipidemia, unspecified: Secondary | ICD-10-CM | POA: Diagnosis present

## 2020-06-17 DIAGNOSIS — I6322 Cerebral infarction due to unspecified occlusion or stenosis of basilar arteries: Secondary | ICD-10-CM | POA: Diagnosis not present

## 2020-06-17 DIAGNOSIS — I63212 Cerebral infarction due to unspecified occlusion or stenosis of left vertebral arteries: Secondary | ICD-10-CM | POA: Diagnosis present

## 2020-06-17 DIAGNOSIS — Z8673 Personal history of transient ischemic attack (TIA), and cerebral infarction without residual deficits: Secondary | ICD-10-CM | POA: Diagnosis not present

## 2020-06-17 DIAGNOSIS — I6389 Other cerebral infarction: Secondary | ICD-10-CM | POA: Diagnosis not present

## 2020-06-17 DIAGNOSIS — I639 Cerebral infarction, unspecified: Secondary | ICD-10-CM | POA: Diagnosis present

## 2020-06-17 DIAGNOSIS — Z20822 Contact with and (suspected) exposure to covid-19: Secondary | ICD-10-CM | POA: Diagnosis present

## 2020-06-17 DIAGNOSIS — Z809 Family history of malignant neoplasm, unspecified: Secondary | ICD-10-CM | POA: Diagnosis not present

## 2020-06-17 DIAGNOSIS — I6381 Other cerebral infarction due to occlusion or stenosis of small artery: Secondary | ICD-10-CM | POA: Diagnosis present

## 2020-06-17 DIAGNOSIS — G8321 Monoplegia of upper limb affecting right dominant side: Secondary | ICD-10-CM | POA: Diagnosis present

## 2020-06-17 DIAGNOSIS — R200 Anesthesia of skin: Secondary | ICD-10-CM | POA: Diagnosis present

## 2020-06-17 DIAGNOSIS — E1142 Type 2 diabetes mellitus with diabetic polyneuropathy: Secondary | ICD-10-CM | POA: Diagnosis present

## 2020-06-17 DIAGNOSIS — I6930 Unspecified sequelae of cerebral infarction: Secondary | ICD-10-CM | POA: Diagnosis not present

## 2020-06-17 DIAGNOSIS — Z79899 Other long term (current) drug therapy: Secondary | ICD-10-CM | POA: Diagnosis not present

## 2020-06-17 LAB — URINALYSIS, ROUTINE W REFLEX MICROSCOPIC
Bacteria, UA: NONE SEEN
Bilirubin Urine: NEGATIVE
Glucose, UA: >=500 mg/dL — AB
Hgb urine dipstick: NEGATIVE
Ketones, ur: NEGATIVE mg/dL
Leukocytes,Ua: NEGATIVE
Nitrite: NEGATIVE
Protein, ur: NEGATIVE mg/dL
Specific Gravity, Urine: 1.024 (ref 1.005–1.030)
pH: 5 (ref 5.0–8.0)

## 2020-06-17 LAB — ECHOCARDIOGRAM COMPLETE BUBBLE STUDY
Area-P 1/2: 3.48 cm2
S' Lateral: 3.3 cm

## 2020-06-17 LAB — GLUCOSE, CAPILLARY: Glucose-Capillary: 262 mg/dL — ABNORMAL HIGH (ref 70–99)

## 2020-06-17 LAB — LIPID PANEL
Cholesterol: 256 mg/dL — ABNORMAL HIGH (ref 0–200)
HDL: 36 mg/dL — ABNORMAL LOW (ref >40)
LDL Cholesterol: 175 mg/dL — ABNORMAL HIGH (ref 0–99)
Total CHOL/HDL Ratio: 7.1 RATIO
Triglycerides: 225 mg/dL — ABNORMAL HIGH (ref <150)
VLDL: 45 mg/dL — ABNORMAL HIGH (ref 0–40)

## 2020-06-17 LAB — RAPID URINE DRUG SCREEN, HOSP PERFORMED
Amphetamines: NOT DETECTED
Barbiturates: NOT DETECTED
Benzodiazepines: NOT DETECTED
Cocaine: POSITIVE — AB
Opiates: NOT DETECTED
Tetrahydrocannabinol: POSITIVE — AB

## 2020-06-17 LAB — HEMOGLOBIN A1C
Hgb A1c MFr Bld: 9.5 % — ABNORMAL HIGH (ref 4.8–5.6)
Mean Plasma Glucose: 225.95 mg/dL

## 2020-06-17 LAB — HIV ANTIBODY (ROUTINE TESTING W REFLEX): HIV Screen 4th Generation wRfx: NONREACTIVE

## 2020-06-17 LAB — CBG MONITORING, ED
Glucose-Capillary: 303 mg/dL — ABNORMAL HIGH (ref 70–99)
Glucose-Capillary: 230 mg/dL — ABNORMAL HIGH (ref 70–99)
Glucose-Capillary: 286 mg/dL — ABNORMAL HIGH (ref 70–99)
Glucose-Capillary: 301 mg/dL — ABNORMAL HIGH (ref 70–99)

## 2020-06-17 LAB — TSH: TSH: 1.485 u[IU]/mL (ref 0.350–4.500)

## 2020-06-17 MED ORDER — DM-GUAIFENESIN ER 30-600 MG PO TB12
1.0000 | ORAL_TABLET | Freq: Two times a day (BID) | ORAL | Status: DC | PRN
  Administered 2020-06-17 – 2020-06-18 (×2): 1 via ORAL
  Filled 2020-06-17 (×2): qty 1

## 2020-06-17 MED ORDER — IPRATROPIUM-ALBUTEROL 0.5-2.5 (3) MG/3ML IN SOLN
3.0000 mL | Freq: Two times a day (BID) | RESPIRATORY_TRACT | Status: DC
  Administered 2020-06-17 – 2020-06-19 (×5): 3 mL via RESPIRATORY_TRACT
  Filled 2020-06-17 (×4): qty 3

## 2020-06-17 MED ORDER — CLOPIDOGREL BISULFATE 75 MG PO TABS
75.0000 mg | ORAL_TABLET | Freq: Every day | ORAL | Status: DC
  Administered 2020-06-17 – 2020-06-19 (×3): 75 mg via ORAL
  Filled 2020-06-17 (×3): qty 1

## 2020-06-17 MED ORDER — INSULIN ASPART 100 UNIT/ML ~~LOC~~ SOLN
8.0000 [IU] | Freq: Once | SUBCUTANEOUS | Status: AC
  Administered 2020-06-17: 8 [IU] via SUBCUTANEOUS

## 2020-06-17 MED ORDER — LORATADINE 10 MG PO TABS
10.0000 mg | ORAL_TABLET | Freq: Every day | ORAL | Status: DC
  Administered 2020-06-17 – 2020-06-19 (×3): 10 mg via ORAL
  Filled 2020-06-17 (×3): qty 1

## 2020-06-17 MED ORDER — ASPIRIN 81 MG PO TBEC: 81.0000 mg | DELAYED_RELEASE_TABLET | Freq: Every day | ORAL | Status: DC

## 2020-06-17 MED ORDER — ROSUVASTATIN CALCIUM 20 MG PO TABS
20.0000 mg | ORAL_TABLET | Freq: Every day | ORAL | Status: DC
  Administered 2020-06-17 – 2020-06-19 (×3): 20 mg via ORAL
  Filled 2020-06-17: qty 1
  Filled 2020-06-17: qty 4
  Filled 2020-06-17: qty 1

## 2020-06-17 MED ORDER — GABAPENTIN 600 MG PO TABS
600.0000 mg | ORAL_TABLET | Freq: Three times a day (TID) | ORAL | Status: DC
  Administered 2020-06-17 – 2020-06-19 (×7): 600 mg via ORAL
  Filled 2020-06-17 (×8): qty 1

## 2020-06-17 MED ORDER — AMITRIPTYLINE HCL 25 MG PO TABS
25.0000 mg | ORAL_TABLET | Freq: Every day | ORAL | Status: DC
  Administered 2020-06-17 – 2020-06-19 (×3): 25 mg via ORAL
  Filled 2020-06-17 (×3): qty 1

## 2020-06-17 MED ORDER — ACETAMINOPHEN 650 MG RE SUPP: 650.0000 mg | RECTAL | Status: DC | PRN

## 2020-06-17 MED ORDER — LACTATED RINGERS IV BOLUS
1500.0000 mL | Freq: Once | INTRAVENOUS | Status: AC
  Administered 2020-06-17: 1500 mL via INTRAVENOUS

## 2020-06-17 MED ORDER — ACETAMINOPHEN 160 MG/5ML PO SOLN: 650.0000 mg | ORAL | Status: DC | PRN

## 2020-06-17 MED ORDER — INSULIN GLARGINE 100 UNIT/ML ~~LOC~~ SOLN
20.0000 [IU] | Freq: Every day | SUBCUTANEOUS | Status: DC
  Administered 2020-06-17 – 2020-06-18 (×2): 20 [IU] via SUBCUTANEOUS
  Filled 2020-06-17 (×2): qty 0.2

## 2020-06-17 MED ORDER — ASPIRIN 81 MG PO CHEW
324.0000 mg | CHEWABLE_TABLET | Freq: Once | ORAL | Status: AC
  Administered 2020-06-17: 324 mg via ORAL
  Filled 2020-06-17: qty 4

## 2020-06-17 MED ORDER — INSULIN ASPART 100 UNIT/ML ~~LOC~~ SOLN
0.0000 [IU] | Freq: Three times a day (TID) | SUBCUTANEOUS | Status: DC
  Administered 2020-06-17: 11 [IU] via SUBCUTANEOUS
  Administered 2020-06-17: 15 [IU] via SUBCUTANEOUS
  Administered 2020-06-17: 11 [IU] via SUBCUTANEOUS
  Administered 2020-06-17 – 2020-06-18 (×2): 7 [IU] via SUBCUTANEOUS
  Administered 2020-06-18: 15 [IU] via SUBCUTANEOUS
  Administered 2020-06-19: 11 [IU] via SUBCUTANEOUS
  Administered 2020-06-19: 7 [IU] via SUBCUTANEOUS

## 2020-06-17 MED ORDER — IPRATROPIUM-ALBUTEROL 0.5-2.5 (3) MG/3ML IN SOLN: 3.0000 mL | RESPIRATORY_TRACT | Status: DC | PRN

## 2020-06-17 MED ORDER — ASPIRIN 81 MG PO CHEW
81.0000 mg | CHEWABLE_TABLET | Freq: Every day | ORAL | Status: DC
  Administered 2020-06-18 – 2020-06-19 (×2): 81 mg via ORAL
  Filled 2020-06-17 (×2): qty 1

## 2020-06-17 MED ORDER — GABAPENTIN 300 MG PO CAPS: 300.0000 mg | ORAL_CAPSULE | Freq: Three times a day (TID) | ORAL | Status: DC

## 2020-06-17 MED ORDER — HYDROCHLOROTHIAZIDE 12.5 MG PO CAPS
12.5000 mg | ORAL_CAPSULE | Freq: Every day | ORAL | Status: DC
  Filled 2020-06-17: qty 1

## 2020-06-17 MED ORDER — LISINOPRIL 20 MG PO TABS
20.0000 mg | ORAL_TABLET | Freq: Every day | ORAL | Status: DC
  Filled 2020-06-17: qty 1

## 2020-06-17 MED ORDER — LEVOCETIRIZINE DIHYDROCHLORIDE 5 MG PO TABS: 5.0000 mg | ORAL_TABLET | Freq: Every day | ORAL | Status: DC

## 2020-06-17 MED ORDER — STROKE: EARLY STAGES OF RECOVERY BOOK
Freq: Once | Status: DC
  Filled 2020-06-17 (×2): qty 1

## 2020-06-17 MED ORDER — ACETAMINOPHEN 325 MG PO TABS
650.0000 mg | ORAL_TABLET | ORAL | Status: DC | PRN
  Administered 2020-06-18 (×2): 650 mg via ORAL
  Filled 2020-06-17 (×2): qty 2

## 2020-06-17 NOTE — ED Notes (Signed)
Assisted pt to the restroom. Pt a little unsteady on feet.

## 2020-06-17 NOTE — Evaluation (Signed)
Physical Therapy Evaluation Patient Details Name: Kim Boyd MRN: [id number] DOB: 24-Apr-1961 Today's Date: 06/17/2020   History of Present Illness  Pt is a 59 y/o female admitted secondary to R sided weakness and numbness. 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 PMH includes HTN, HTN, and CVA.   Clinical Impression  Pt admitted secondary to problem above with deficits below. Pt presenting with decreased strength, sensation, and coordination in RUE and RLE. Noted functional weakness when taking steps and required min to mod A for steadying. Pt was previously independent prior to admission. Fee she would benefit from CIR level therapies to increase independence prior to return home. Will continue to follow acutely.     Follow Up Recommendations CIR    Equipment Recommendations  None recommended by PT    Recommendations for Other Services       Precautions / Restrictions Precautions Precautions: Fall Restrictions Weight Bearing Restrictions: No      Mobility  Bed Mobility Overal bed mobility: Needs Assistance Bed Mobility: Supine to Sit;Sit to Supine     Supine to sit: Min assist Sit to supine: Min assist   General bed mobility comments: Min A for assist with trunk and LEs.     Transfers Overall transfer level: Needs assistance Equipment used: 1 person hand held assist Transfers: Sit to/from Stand Sit to Stand: Min assist         General transfer comment: Min A for steadying assist to stand.   Ambulation/Gait Ambulation/Gait assistance: Min assist;Mod assist Gait Distance (Feet): 1 Feet Assistive device: 1 person hand held assist       General Gait Details: took steps to/from stretcher this session. Functional weakness noted in RLE and required min to mod A for steadying. Decreased coordination when taking steps with RLE.   Stairs            Wheelchair Mobility    Modified Rankin (Stroke Patients Only) Modified Rankin (Stroke Patients  Only) Pre-Morbid Rankin Score: No symptoms Modified Rankin: Moderately severe disability     Balance Overall balance assessment: Needs assistance Sitting-balance support: No upper extremity supported;Feet supported Sitting balance-Leahy Scale: Fair     Standing balance support: Single extremity supported;During functional activity Standing balance-Leahy Scale: Poor Standing balance comment: Reliant on UE and external support                              Pertinent Vitals/Pain Pain Assessment: No/denies pain    Home Living Family/patient expects to be discharged to:: Private residence Living Arrangements: Alone Available Help at Discharge: Family;Available PRN/intermittently Type of Home: House Home Access: Level entry     Home Layout: One level Home Equipment: Walker - 2 wheels;Cane - single point;Tub bench;Bedside commode      Prior Function Level of Independence: Independent               Hand Dominance        Extremity/Trunk Assessment   Upper Extremity Assessment Upper Extremity Assessment: Defer to OT evaluation;RUE deficits/detail RUE Deficits / Details: Reports decreased sensation throughout and notable weakness. Pt with decreased coordination with finger to nose test.     Lower Extremity Assessment Lower Extremity Assessment: LLE deficits/detail LLE Deficits / Details: Grossly 3+/5 throughout with MMT, however, noted functional weakness during ambulation. Decreased coordination noted with heel to shin.  LLE Sensation: decreased light touch LLE Coordination: decreased fine motor;decreased gross motor    Cervical / Trunk Assessment  Cervical / Trunk Assessment: Normal  Communication   Communication: No difficulties  Cognition Arousal/Alertness: Awake/alert Behavior During Therapy: WFL for tasks assessed/performed Overall Cognitive Status: No family/caregiver present to determine baseline cognitive functioning                                         General Comments      Exercises     Assessment/Plan    PT Assessment Patient needs continued PT services  PT Problem List Decreased strength;Decreased balance;Decreased mobility;Decreased knowledge of use of DME;Decreased knowledge of precautions;Impaired sensation       PT Treatment Interventions Gait training;DME instruction;Therapeutic activities;Functional mobility training;Therapeutic exercise;Balance training;Neuromuscular re-education;Patient/family education    PT Goals (Current goals can be found in the Care Plan section)  Acute Rehab PT Goals Patient Stated Goal: to get stronger PT Goal Formulation: With patient Time For Goal Achievement: 07/01/20 Potential to Achieve Goals: Good    Frequency Min 4X/week   Barriers to discharge        Co-evaluation               AM-PAC PT "6 Clicks" Mobility  Outcome Measure Help needed turning from your back to your side while in a flat bed without using bedrails?: A Little Help needed moving from lying on your back to sitting on the side of a flat bed without using bedrails?: A Little Help needed moving to and from a bed to a chair (including a wheelchair)?: A Little Help needed standing up from a chair using your arms (e.g., wheelchair or bedside chair)?: A Little Help needed to walk in hospital room?: A Lot Help needed climbing 3-5 steps with a railing? : Total 6 Click Score: 15    End of Session Equipment Utilized During Treatment: Gait belt Activity Tolerance: Patient tolerated treatment well Patient left: in bed;with call bell/phone within reach (on stretcher in ED ) Nurse Communication: Mobility status PT Visit Diagnosis: Unsteadiness on feet (R26.81);Muscle weakness (generalized) (M62.81);Difficulty in walking, not elsewhere classified (R26.2)    Time: 6578-4696 PT Time Calculation (min) (ACUTE ONLY): 13 min   Charges:   PT Evaluation $PT Eval Moderate Complexity: 1 Mod           Farley Ly, PT, DPT  Acute Rehabilitation Services  Pager: [phone] Office: [phone]   Lehman Prom 06/17/2020, 2:22 PM

## 2020-06-17 NOTE — Progress Notes (Signed)
Inpatient Rehab Admissions Coordinator Note:   Per therapy recommendations, pt was screened for CIR candidacy by Katai Marsico, MS CCC-SLP. At this time, Pt. Appears to have functional decline and is a good candidate for CIR. Will place order for rehab consult per protocol.  Please contact me with questions.   Ona Roehrs, MS, CCC-SLP Rehab Admissions Coordinator  [phone] (celll) [phone] (office)  

## 2020-06-17 NOTE — Progress Notes (Signed)
59 year old with history of chronic low back pain, DM2, HTN, peripheral neuropathy, previous CVA 2017 came to [facility] with complaints of right-sided numbness and tingling a day prior to admission associated with some weakness, severe occipital headache.  CT head showed possible left subacute/acute thalamic infarct, neurology team was consulted.  Started on aspirin and Plavix.  Seen and examined at bedside feels okay but still having numbness on the right side of the face fingers and lower extremity.  No weakness noted.  Vital signs are overall stable Only having some numbness on the right facial upper extremity and lower extremity otherwise rest of the neuro exam is unremarkable.  Does have bilateral rhonchi.  Echocardiogram-results pending A1c-9.5 LDL-175 UDS-positive for cocaine and THC MRI brain-with acute left-sided thalamic infarct and old right hemispheric infarct MRI-no significant findings CT head-subacute/acute thalamic infarct   Acute left thalamic infarct-routine CVA work-up.  Neurology team following Insulin-dependent diabetes mellitus type 2-diabetic diet, insulin sliding scale and Accu-Cheks, diabetic coordinator Peripheral neuropathy-gabapentin 600 mg 3 times daily Essential hypertension-permissive hypertension therefore will hold off on hydrochlorothiazide and lisinopril  Time spent-15 minutes  Stephania Fragmin MD [facility]

## 2020-06-17 NOTE — Progress Notes (Signed)
  Echocardiogram 2D Echocardiogram has been performed.  Delcie Roch 06/17/2020, 9:41 AM

## 2020-06-17 NOTE — ED Notes (Signed)
Lunch Tray Ordered @ 1443.

## 2020-06-17 NOTE — Consult Note (Addendum)
Neurology Consultation Reason for Consult: Acute onset right-sided sensory changes and dysmetria Referring Physician: Marcell Barlow  CC: Right arm numbness   History is obtained from: Patient and chart review  HPI: Ritha Sampedro is a 59 y.o. female with a past medical history significant for hypertension, hyperlipidemia, type 2 diabetes complicated by neuropathy, ongoing tobacco abuse (1/4 - 1/2 pack per day).  She has a history of prior stroke in April 2017 (right hemisphere scattered strokes with some mild reduction in left upper extremity strength).  Etiology of that stroke is not documented.  With this most recent event, she reports that she went to bed and woke up unable to feel well on the right side of her body.  She initially attributed this to a pinched nerve and went about her day went back to sleep and then given that her symptoms still had not resolved, she presented to the ED for evaluation.  She also felt like she had a little bit of difficulty with ambulation and that she stumbled when she was getting up.  She had had a mild headache that self resolved while in the ED otherwise denies any recent headaches.  She states that she has been having some nausea since this started 2 days ago as well as blurry vision.  She says she has baseline hearing loss as well as hot/cold flashes.  She reports she has been nonadherent to cholesterol medication for the past year.  She notes her blood glucose at home typically runs anywhere from 300 into the 5/600s though occasionally she is in the 150-200s range   LKW: 11/28 tPA given?: No, due to out of the window  ROS: A 14 point ROS was performed and is negative except as noted in the HPI.   Past Medical History:  Diagnosis Date  . Diabetes mellitus without complication (HCC)   . Hypertension   . Stroke [facility])    April 2017   Past Surgical History:  Procedure Laterality Date  . CESAREAN SECTION     Current Outpatient Medications   Medication Instructions  . amitriptyline (ELAVIL) 25 MG tablet 1 tablet, Oral, Daily  . aspirin 81 MG EC tablet 1 tablet, Oral, Daily  . gabapentin (NEURONTIN) 300 mg, Oral, 3 times daily  . gabapentin (NEURONTIN) 600 mg, Oral, 3 times daily  . glipiZIDE (GLUCOTROL) 5 mg, Oral, 2 times daily  . hydrochlorothiazide (MICROZIDE) 12.5 mg, Oral, Daily  . JANUMET 50-1000 MG tablet 1 tablet, 2 times daily  . Lantus 20 Units, Subcutaneous, Daily  . levocetirizine (XYZAL) 5 mg, Oral, Daily  . lisinopril (ZESTRIL) 20 mg, Oral, Daily  . rosuvastatin (CRESTOR) 10 mg, Oral, Daily    No family history on file. Per CareEverywhere:  Mother Family history of cancer (No Information) N/A (No Notes)  Mother Hypertensive disorder (No Information) N/A (No Notes)  Paternal Grandmother Family history of cancer (No Information) N/A (No Notes)  Maternal Uncle Heart disease (No Information) N/A (No Notes)  (10/13 Dr. Hart Robinsons notes)   Social History:  reports that she has been smoking cigarettes. She has been smoking about 0.50 packs per day. She has never used smokeless tobacco. She reports current alcohol use. She reports current drug use.   Exam: Current vital signs: BP (!) 154/81 (BP Location: Right Arm)   Pulse 70   Temp 98.5 F (36.9 C) (Oral)   Resp 15   Ht 5\' 1"  (1.549 m)   Wt 56.7 kg   SpO2 95%   BMI 23.62  kg/m  Vital signs in last 24 hours: Temp:  [98.4 F (36.9 C)-98.5 F (36.9 C)] 98.5 F (36.9 C) (11/29 2034) Pulse Rate:  [70-86] 70 (11/30 0115) Resp:  [15-26] 15 (11/30 0115) BP: (120-158)/(79-106) 154/81 (11/30 0115) SpO2:  [95 %-100 %] 95 % (11/30 0115) Weight:  [56.7 kg] 56.7 kg (11/29 1833)   Physical Exam  Constitutional: Appears well-developed and well-nourished.  Psych: Affect appropriate to situation, pleasant and cooperative Eyes: No scleral injection HENT: No OP obstruction MSK: no joint deformities.  Cardiovascular: Normal rate and regular rhythm.  Respiratory:  Short of breath with speaking or minimal exertion GI: Soft.  No distension. There is no tenderness.  Skin: WDI  Neuro: Mental Status: Patient is awake, alert, oriented to person, place, month, year, and situation. Patient is able to give a clear and coherent history. No signs of aphasia or neglect Cranial Nerves: II: Visual Fields are full. Pupils are equal, round, and reactive to light.  III,IV, VI: EOMI without ptosis or diploplia.  V: Facial sensation is variably reported by the patient VII: Facial movement is symmetric.  VIII: hearing is intact to voice X: Uvula elevates symmetrically XI: Shoulder shrug is symmetric. XII: tongue is midline without atrophy or fasciculations.  Motor: Tone is mildly increased in the left upper extremity. Bulk is normal. 5/5 strength was present in all four extremities,  Sensory: Sensation is reduced in the RUE.  Deep Tendon Reflexes: 2+ on the right biceps and patella, 3+ on the left  Cerebellar: FNF and HKS are intact bilaterally  NIHSS 1 for sensory symptoms  I have reviewed labs in epic and the results pertinent to this consultation are: Glucose > 500 on arrival  Cr 0.6  Hgb 16.3   I have reviewed the images obtained: HCT w/ age indeterminate hypodensities MRI brain with acute left thalamic infarct and older R hemisphere infarcts as described MRA without significant atherosclerosis    Impression: Left thalamic lacunar stroke in the setting of uncontrolled vascular risk factors.  Needs admission for medication optimization and to complete full work-up  Recommendations:  # L thalamic stroke - Stroke labs HgbA1c, fasting lipid panel - MRI brain completed as above on my recommendation  - MRA of the brain without contrast and MRA neck w/wo completed as above on my recommendation  - Frequent neuro checks - Echocardiogram - Prophylactic therapy-Antiplatelet med: Aspirin - dose 325mg  PO or 300mg  PR, followed by 81 mg daily - Plavix 300  mg load with 75 mg daily for 21 day course  - Risk factor modification - Telemetry monitoring; 30 day event monitor on discharge if no arrythmias captured  - Blood pressure goal:  - Normotension - Gradual control of hyperglycemia, goal normoglycemia - PT consult, OT consult, Speech consult - Stroke team to follow  MD-PhD [facility] [phone]

## 2020-06-17 NOTE — Progress Notes (Signed)
STROKE TEAM PROGRESS NOTE   INTERVAL HISTORY Pt lying in bed, awake alert, not in distress.  Still has right face arm and leg tingling sensation, however seems symmetrical on light touch exam.  Patient admitted that her diabetes has not been in good control.  She was admitted she takes [facility] however denies cocaine.  UDS showed both positive for THC and cocaine.  Vitals:   06/17/20 1145 06/17/20 1230 06/17/20 1315 06/17/20 1400  BP: (!) 151/107 (!) 146/77 109/68 119/89  Pulse: 73 68 69 72  Resp: 17 16 15 16   Temp:      TempSrc:      SpO2: 99% 96% 95% 98%  Weight:      Height:       CBC:  Recent Labs  Lab 06/16/20 1834 06/16/20 2134  WBC 9.7  --   NEUTROABS 7.3  --   HGB 14.7 16.3*  HCT 45.1 48.0*  MCV 90.7  --   PLT 213  --    Basic Metabolic Panel:  Recent Labs  Lab 06/16/20 1834 06/16/20 2134  NA 131* 136  K 3.9 3.9  CL 99 99  CO2 20*  --   GLUCOSE 530* 445*  BUN 14 17  CREATININE 0.94 0.60  CALCIUM 9.2  --    Lipid Panel:  Recent Labs  Lab 06/17/20 0804  CHOL 256*  TRIG 225*  HDL 36*  CHOLHDL 7.1  VLDL 45*  LDLCALC 175*   HgbA1c:  Recent Labs  Lab 06/16/20 1834  HGBA1C 9.5*   Urine Drug Screen:  Recent Labs  Lab 06/17/20 0926  LABOPIA NONE DETECTED  COCAINSCRNUR POSITIVE*  LABBENZ NONE DETECTED  AMPHETMU NONE DETECTED  THCU POSITIVE*  LABBARB NONE DETECTED    Alcohol Level No results for input(s): ETH in the last 168 hours.  IMAGING past 24 hours CT Head Wo Contrast  Result Date: 06/16/2020 CLINICAL DATA:  59 year old female with numbness, tingling, and paresthesia. EXAM: CT HEAD WITHOUT CONTRAST TECHNIQUE: Contiguous axial images were obtained from the base of the skull through the vertex without intravenous contrast. COMPARISON:  None. FINDINGS: Brain: The ventricles and sulci appropriate size for patient's age. There is a 1.3 x 0.9 cm hypodense area in the left thalamus consistent with an infarct, likely acute or subacute. Clinical  correlation and further evaluation with MRI is recommended. 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 There is no acute intracranial hemorrhage. No mass effect or midline shift. No extra-axial fluid collection. Vascular: No hyperdense vessel or unexpected calcification. Skull: Normal. Negative for fracture or focal lesion. Sinuses/Orbits: No acute finding. Other: None IMPRESSION: 1. Left thalamic infarct, likely acute or subacute. 2. No acute intracranial hemorrhage. 3. 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 These results were called by telephone at the time of interpretation on 06/16/2020 at 8:21 pm to provider [facility] , who verbally acknowledged these results. Electronically Signed   By: [facility] M.D.   On: 06/16/2020 20:28   MR ANGIO HEAD WO CONTRAST  Result Date: 06/16/2020 CLINICAL DATA:  Acute neurologic deficit EXAM: MR HEAD WITHOUT CONTRAST MR CIRCLE OF WILLIS WITHOUT CONTRAST MRA OF THE NECK WITHOUT AND WITH CONTRAST TECHNIQUE: Multiplanar, multiecho pulse sequences of the brain, circle of willis and surrounding structures were obtained without intravenous contrast. Angiographic images of the neck were obtained using MRA technique without and with intravenous contrast. CONTRAST:  5.22mL GADAVIST GADOBUTROL 1 MMOL/ML IV SOLN COMPARISON:  None. FINDINGS: MR HEAD FINDINGS Brain: Small focus of acute ischemia in  the left thalamus. Multifocal hyperintense T2-weighted signal within the white matter. There is an old right occipital cortical infarct. Multiple old corona radiata small vessel infarcts. Normal volume of CSF spaces. No chronic microhemorrhage. Normal midline structures. Vascular: Major flow voids are preserved. Skull and upper cervical spine: Normal calvarium and skull base. Visualized upper cervical spine and soft tissues are normal. Sinuses/Orbits:No paranasal sinus fluid levels or advanced mucosal thickening. No mastoid or middle ear effusion. Normal orbits. MR  CIRCLE OF WILLIS FINDINGS POSTERIOR CIRCULATION: --Vertebral arteries: Normal --Inferior cerebellar arteries: Normal. --Basilar artery: Normal. --Superior cerebellar arteries: Normal. --Posterior cerebral arteries: Normal. ANTERIOR CIRCULATION: --Intracranial internal carotid arteries: Normal. --Anterior cerebral arteries (ACA): Normal. --Middle cerebral arteries (MCA): Normal. ANATOMIC VARIANTS: Fetal origin of the left PCA. Both P comm's are present. MRA NECK FINDINGS There is an aberrant right subclavian artery. The visualized subclavian arteries are both patent. There is no carotid artery stenosis. The vertebral arteries are left-dominant. Both origins are clearly patent. There is no stenosis or other abnormality. IMPRESSION: 1. Small focus of acute ischemia in the left thalamus. No hemorrhage or mass effect. 2. Old right occipital cortical infarct and multiple old corona radiata small vessel infarcts. 3. Normal MRA of the head and neck. Electronically Signed   By: Deatra Robinson M.D.   On: 06/16/2020 22:49   MR ANGIO NECK W WO CONTRAST  Result Date: 06/16/2020 CLINICAL DATA:  Acute neurologic deficit EXAM: MR HEAD WITHOUT CONTRAST MR CIRCLE OF WILLIS WITHOUT CONTRAST MRA OF THE NECK WITHOUT AND WITH CONTRAST TECHNIQUE: Multiplanar, multiecho pulse sequences of the brain, circle of willis and surrounding structures were obtained without intravenous contrast. Angiographic images of the neck were obtained using MRA technique without and with intravenous contrast. CONTRAST:  5.25mL GADAVIST GADOBUTROL 1 MMOL/ML IV SOLN COMPARISON:  None. FINDINGS: MR HEAD FINDINGS Brain: Small focus of acute ischemia in the left thalamus. Multifocal hyperintense T2-weighted signal within the white matter. There is an old right occipital cortical infarct. Multiple old corona radiata small vessel infarcts. Normal volume of CSF spaces. No chronic microhemorrhage. Normal midline structures. Vascular: Major flow voids are preserved.  Skull and upper cervical spine: Normal calvarium and skull base. Visualized upper cervical spine and soft tissues are normal. Sinuses/Orbits:No paranasal sinus fluid levels or advanced mucosal thickening. No mastoid or middle ear effusion. Normal orbits. MR CIRCLE OF WILLIS FINDINGS POSTERIOR CIRCULATION: --Vertebral arteries: Normal --Inferior cerebellar arteries: Normal. --Basilar artery: Normal. --Superior cerebellar arteries: Normal. --Posterior cerebral arteries: Normal. ANTERIOR CIRCULATION: --Intracranial internal carotid arteries: Normal. --Anterior cerebral arteries (ACA): Normal. --Middle cerebral arteries (MCA): Normal. ANATOMIC VARIANTS: Fetal origin of the left PCA. Both P comm's are present. MRA NECK FINDINGS There is an aberrant right subclavian artery. The visualized subclavian arteries are both patent. There is no carotid artery stenosis. The vertebral arteries are left-dominant. Both origins are clearly patent. There is no stenosis or other abnormality. IMPRESSION: 1. Small focus of acute ischemia in the left thalamus. No hemorrhage or mass effect. 2. Old right occipital cortical infarct and multiple old corona radiata small vessel infarcts. 3. Normal MRA of the head and neck. Electronically Signed   By: Deatra Robinson M.D.   On: 06/16/2020 22:49   MR BRAIN WO CONTRAST  Result Date: 06/16/2020 CLINICAL DATA:  Acute neurologic deficit EXAM: MR HEAD WITHOUT CONTRAST MR CIRCLE OF WILLIS WITHOUT CONTRAST MRA OF THE NECK WITHOUT AND WITH CONTRAST TECHNIQUE: Multiplanar, multiecho pulse sequences of the brain, circle of willis and surrounding structures were obtained without  intravenous contrast. Angiographic images of the neck were obtained using MRA technique without and with intravenous contrast. CONTRAST:  5.88mL GADAVIST GADOBUTROL 1 MMOL/ML IV SOLN COMPARISON:  None. FINDINGS: MR HEAD FINDINGS Brain: Small focus of acute ischemia in the left thalamus. Multifocal hyperintense T2-weighted signal  within the white matter. There is an old right occipital cortical infarct. Multiple old corona radiata small vessel infarcts. Normal volume of CSF spaces. No chronic microhemorrhage. Normal midline structures. Vascular: Major flow voids are preserved. Skull and upper cervical spine: Normal calvarium and skull base. Visualized upper cervical spine and soft tissues are normal. Sinuses/Orbits:No paranasal sinus fluid levels or advanced mucosal thickening. No mastoid or middle ear effusion. Normal orbits. MR CIRCLE OF WILLIS FINDINGS POSTERIOR CIRCULATION: --Vertebral arteries: Normal --Inferior cerebellar arteries: Normal. --Basilar artery: Normal. --Superior cerebellar arteries: Normal. --Posterior cerebral arteries: Normal. ANTERIOR CIRCULATION: --Intracranial internal carotid arteries: Normal. --Anterior cerebral arteries (ACA): Normal. --Middle cerebral arteries (MCA): Normal. ANATOMIC VARIANTS: Fetal origin of the left PCA. Both P comm's are present. MRA NECK FINDINGS There is an aberrant right subclavian artery. The visualized subclavian arteries are both patent. There is no carotid artery stenosis. The vertebral arteries are left-dominant. Both origins are clearly patent. There is no stenosis or other abnormality. IMPRESSION: 1. Small focus of acute ischemia in the left thalamus. No hemorrhage or mass effect. 2. Old right occipital cortical infarct and multiple old corona radiata small vessel infarcts. 3. Normal MRA of the head and neck. Electronically Signed   By: Deatra Robinson M.D.   On: 06/16/2020 22:49   ECHOCARDIOGRAM COMPLETE BUBBLE STUDY  Result Date: 06/17/2020    ECHOCARDIOGRAM REPORT   Patient Name:   KARIANN WECKER Date of Exam: 06/17/2020 Medical Rec #:  [id number]       Height:       61.0 in Accession #:    [id number]      Weight:       125.0 lb Date of Birth:  [birth year]       BSA:          1.547 m Patient Age:    58 years        BP:           140/78 mmHg Patient Gender: F               HR:            74 bpm. Exam Location:  Inpatient Procedure: 2D Echo and Saline Contrast Bubble Study Indications:    stroke 434.91  History:        Patient has no prior history of Echocardiogram examinations.                 Risk Factors:Hypertension and Diabetes.  Sonographer:    Delcie Roch Referring Phys: [id number] Deno Lunger [place] IMPRESSIONS  1. Left ventricular ejection fraction, by estimation, is 55 to 60%. The left ventricle has normal function. The left ventricle has no regional wall motion abnormalities. Left ventricular diastolic parameters are indeterminate.  2. Right ventricular systolic function is normal. The right ventricular size is normal. Tricuspid regurgitation signal is inadequate for assessing PA pressure.  3. The mitral valve is grossly normal. Trivial mitral valve regurgitation. No evidence of mitral stenosis.  4. The aortic valve is grossly normal. Aortic valve regurgitation is not visualized. No aortic stenosis is present.  5. The inferior vena cava is normal in size with greater than 50% respiratory variability, suggesting right atrial pressure of 3 mmHg.  6. Agitated saline contrast bubble study was negative, with no evidence of any interatrial shunt. Comparison(s): No prior Echocardiogram. Conclusion(s)/Recommendation(s): Normal biventricular function without evidence of hemodynamically significant valvular heart disease. No intracardiac source of embolism detected on this transthoracic study. A transesophageal echocardiogram is recommended to exclude cardiac source of embolism if clinically indicated. FINDINGS  Left Ventricle: Left ventricular ejection fraction, by estimation, is 55 to 60%. The left ventricle has normal function. The left ventricle has no regional wall motion abnormalities. The left ventricular internal cavity size was normal in size. There is  no left ventricular hypertrophy. Left ventricular diastolic parameters are indeterminate. Right Ventricle: The right ventricular  size is normal. No increase in right ventricular wall thickness. Right ventricular systolic function is normal. Tricuspid regurgitation signal is inadequate for assessing PA pressure. Left Atrium: Left atrial size was normal in size. Right Atrium: Right atrial size was normal in size. Pericardium: There is no evidence of pericardial effusion. Presence of pericardial fat pad. Mitral Valve: The mitral valve is grossly normal. Trivial mitral valve regurgitation. No evidence of mitral valve stenosis. Tricuspid Valve: The tricuspid valve is grossly normal. Tricuspid valve regurgitation is trivial. No evidence of tricuspid stenosis. Aortic Valve: The aortic valve is grossly normal. Aortic valve regurgitation is not visualized. No aortic stenosis is present. Pulmonic Valve: The pulmonic valve was not well visualized. Pulmonic valve regurgitation is not visualized. Aorta: The aortic root, ascending aorta and aortic arch are all structurally normal, with no evidence of dilitation or obstruction. Venous: The inferior vena cava is normal in size with greater than 50% respiratory variability, suggesting right atrial pressure of 3 mmHg. IAS/Shunts: The atrial septum is grossly normal. Agitated saline contrast was given intravenously to evaluate for intracardiac shunting. Agitated saline contrast bubble study was negative, with no evidence of any interatrial shunt.  LEFT VENTRICLE PLAX 2D LVIDd:         4.70 cm  Diastology LVIDs:         3.30 cm  LV e' medial:    5.22 cm/s LV PW:         1.00 cm  LV E/e' medial:  17.8 LV IVS:        1.10 cm  LV e' lateral:   6.42 cm/s LVOT diam:     2.00 cm  LV E/e' lateral: 14.4 LV SV:         58 LV SV Index:   37 LVOT Area:     3.14 cm  RIGHT VENTRICLE             IVC RV S prime:     12.20 cm/s  IVC diam: 1.30 cm TAPSE (M-mode): 1.8 cm LEFT ATRIUM             Index       RIGHT ATRIUM           Index LA diam:        3.60 cm 2.33 cm/m  RA Area:     11.60 cm LA Vol (A2C):   33.7 ml 21.79 ml/m  RA Volume:   24.80 ml  16.03 ml/m LA Vol (A4C):   36.9 ml 23.86 ml/m LA Biplane Vol: 35.7 ml 23.08 ml/m  AORTIC VALVE LVOT Vmax:   93.80 cm/s LVOT Vmean:  60.200 cm/s LVOT VTI:    0.184 m  AORTA Ao Root diam: 3.10 cm Ao Asc diam:  3.00 cm MITRAL VALVE MV Area (PHT): 3.48 cm     SHUNTS MV Decel Time:  218 msec     Systemic VTI:  0.18 m MV E velocity: 92.70 cm/s   Systemic Diam: 2.00 cm MV A velocity: 110.00 cm/s MV E/A ratio:  0.84 Jodelle Red MD Electronically signed by Jodelle Red MD Signature Date/Time: 06/17/2020/1:34:44 PM    Final     PHYSICAL EXAM  Temp:  [97.5 F (36.4 C)-98.5 F (36.9 C)] 97.5 F (36.4 C) (11/30 1430) Pulse Rate:  [65-83] 78 (11/30 1845) Resp:  [11-27] 16 (11/30 1845) BP: (109-204)/(56-112) 150/86 (11/30 1845) SpO2:  [95 %-100 %] 98 % (11/30 1845)  General - Well nourished, well developed, in no apparent distress.  Ophthalmologic - fundi not visualized due to noncooperation.  Cardiovascular - Regular rhythm and rate.  Mental Status -  Level of arousal and orientation to time, place, and person were intact. Language including expression, naming, repetition, comprehension was assessed and found intact. Fund of Knowledge was assessed and was intact.  Cranial Nerves II - XII - II - Visual field intact OU. III, IV, VI - Extraocular movements intact. V - Facial sensation intact bilaterally, still has subjectively tingling on the right face. VII - Facial movement intact bilaterally. VIII - Hearing & vestibular intact bilaterally. X - Palate elevates symmetrically. XI - Chin turning & shoulder shrug intact bilaterally. XII - Tongue protrusion intact.  Motor Strength - The patient's strength was normal in all extremities except mild pronator drift and dexterity difficulty on the right.  Bulk was normal and fasciculations were absent.   Motor Tone - Muscle tone was assessed at the neck and appendages and was normal.  Reflexes - The patient's  reflexes were symmetrical in all extremities and she had no pathological reflexes.  Sensory - Light touch, temperature/pinprick were assessed and were symmetrical, however, subjective tingling sensation on the right arm and leg.    Coordination - The patient had normal movements in the left hand with no ataxia or dysmetria.  However right mild dysmetria still proportional to the weakness.  Tremor was absent.  Gait and Station - deferred.   ASSESSMENT/PLAN Ms. Jaylena Holloway is a 59 y.o. female with history of HTN, HLD, type II DB w/ neuropathy, tobacco abuse presenting with R arm numbness, RLE weakness, HA.   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 Old R occipital infarct.   MRI  Small L thalamic infarct. Old R occipital cortical and multiple old corona radiata small vessel infarcts.   MRA head & neck  Unremarkable   2D Echo w/ bubble EF 55-60%. No source of embolus, neg bubble  LDL 175  HgbA1c 9.5  VTE prophylaxis - SCDs   aspirin 81 mg daily prior to admission, now on aspirin 81 mg daily and clopidogrel 75 mg daily. Continue DAPT x 3 weeks then plavix alone.   Therapy recommendations:  CIR  Disposition:  pending   Hx stroke/TIA  10/2015 - multiple posterior R frontal lobe centrum semiovale, R posterior parietal, R occipital lobe infarcts. Put on asa 325 x 2 wks then 81. A1c 10.3%. LDL 92. Found to have mass R lateral thigh w/ enlarged LNs. F/u w/ [facility].     Hypertension  Stable  On HCTZ and lisinopril . Permissive hypertension (OK if < 220/120) but gradually normalize in 3-5 days . Long-term BP goal normotensive  Hyperlipidemia  Home meds:  crestor 10  Now on crestor 20  LDL 175, goal < 70  Continue statin at  discharge  Diabetes type II Uncontrolled w/ peripheral neuropathy  HgbA1c 9.5, goal < 7.0  CBGs   SSI  On Lantus  Still hyperglycemia  Close PCP follow-up for  better DM control  Tobacco abuse  Current smoker  Smoking cessation counseling provided  Pt is willing to quit  Cocaine abuse  UDS positive for cocaine  Patient denies cocaine use, but admitted that she has recent family reunion and may be somebody in his family used cocaine and she had inhaled it  Cocaine cessation education provided   Other Stroke Risk Factors  ETOH use, advised to drink no more than 1 drink(s) a day  Polysubstance abuse - UDS:  THC POSITIVE, Cocaine POSITIVE. Patient advised to stop using due to stroke risk.  Other Active Problems  Hx depression  Hospital day # 0  Neurology will sign off. Please call with questions. Pt will follow up with [facility] neurology in about 4 weeks. Thanks for the consult.  Marvel Plan, MD PhD Stroke Neurology 06/17/2020 6:55 PM    To contact Stroke Continuity provider, please refer to [email or web address]. After hours, contact General Neurology

## 2020-06-17 NOTE — ED Notes (Signed)
Dinner Tray Ordered @ 1714. 

## 2020-06-17 NOTE — H&P (Signed)
History and Physical    Kim Boyd NWG:[id number] DOB: 09/28/1960 DOA: 06/16/2020  PCP: Renaldo HarrisonAddis, Daniel, DO  Patient coming from: Home   Chief Complaint:  Chief Complaint  Patient presents with  . Numbness     HPI:   59 year old female with past history of chronic low back pain, diabetes mellitus type 2, hypertension, diabetic peripheral neuropathy and previous stroke in 2017 who [facility] department with complaints of right-sided numbness and tingling.  Patient explains that Sunday morning, upon awakening from sleep patient suddenly began to experience right-sided numbness and tingling in the right side of her body.  This includes the right side of her face.  Patient also complains of some associated right hand weakness.  Patient is also complaining of some associated difficulty with balance.  Patient also complains of severe occipital headache that has been waxing and waning.  Patient denies any visual changes, slurring of words or facial droop.  Over the following 24 hours the patient's symptoms did not abate the patient eventually presented to [facility] department for evaluation.  Of note, patient was to remain on aspirin and atorvastatin since her stroke in 2017 but approximately 1 year ago her atorvastatin was discontinued by her primary care provider because "I was on too many medications."  Upon evaluation in the emergency department, noncontrast CT of the head revealed a possible left subacute or acute left thalamic infarct with an old right occipital infarct.  The emergency room provider discussed with Dr. Iver NestleBhagat with neurology who agreed to consult on the patient.  The hospitalist group was then called to assess patient for admission to the hospital.  Review of Systems:   Review of Systems  Neurological: Positive for tingling and focal weakness.  All other systems reviewed and are negative.   Past Medical History:    Diagnosis Date  . Diabetes mellitus without complication (HCC)   . Hypertension   . Stroke [facility](HCC)    April 2017    Past Surgical History:  Procedure Laterality Date  . CESAREAN SECTION       reports that she has been smoking cigarettes. She has been smoking about 0.50 packs per day. She has never used smokeless tobacco. She reports current alcohol use. She reports current drug use.  Allergies  Allergen Reactions  . Other Anaphylaxis, Rash and Hives    Allergy to enzyme injected in animals    No family history on file.   Prior to Admission medications   Medication Sig Start Date End Date Taking? Authorizing Provider  amitriptyline (ELAVIL) 25 MG tablet Take 1 tablet by mouth daily. 02/29/20  Yes [provider]  aspirin 81 MG EC tablet Take 1 tablet by mouth daily.   Yes [provider]  gabapentin (NEURONTIN) 600 MG tablet Take 600 mg by mouth 3 (three) times daily. 01/11/20  Yes [provider]  glipiZIDE (GLUCOTROL) 5 MG tablet Take 5 mg by mouth 2 (two) times daily. 05/22/20  Yes [provider]  hydrochlorothiazide (MICROZIDE) 12.5 MG capsule Take 12.5 mg by mouth daily. 05/07/20  Yes [provider]  LANTUS 100 UNIT/ML injection Inject 20 Units into the skin daily. 01/14/16  Yes [provider]  levocetirizine (XYZAL) 5 MG tablet Take 5 mg by mouth daily. 03/01/20  Yes [provider]  lisinopril (ZESTRIL) 20 MG tablet Take 20 mg by mouth daily. 05/07/20  Yes [provider]  rosuvastatin (CRESTOR) 10 MG tablet Take 10 mg by  mouth daily. 04/30/20  Yes [provider]  gabapentin (NEURONTIN) 300 MG capsule Take 300 mg by mouth 3 (three) times daily. Patient not taking: Reported on 06/17/2020 04/30/20   [provider]  JANUMET 50-1000 MG tablet Take 1 tablet by mouth 2 (two) times daily. Patient not taking: Reported on 06/16/2020 04/30/20   [provider]    Physical  Exam: Vitals:   06/17/20 0200 06/17/20 0351 06/17/20 0352 06/17/20 0645  BP: (!) 169/81 (!) 134/103  (!) 145/85  Pulse: 76  83 66  Resp: 17 (!) 22 (!) 21 (!) 23  Temp:      TempSrc:      SpO2: 97%  100% 98%  Weight:      Height:        Constitutional: Acute alert and oriented x3, no associated distress.   Skin: no rashes, no lesions, good skin turgor noted. Eyes: Pupils are equally reactive to light.  No evidence of scleral icterus or conjunctival pallor.  ENMT: Moist mucous membranes noted.  Posterior pharynx clear of any exudate or lesions.   Neck: normal, supple, no masses, no thyromegaly.  No evidence of jugular venous distension.   Respiratory: clear to auscultation bilaterally, no wheezing, no crackles. Normal respiratory effort. No accessory muscle use.  Cardiovascular: Regular rate and rhythm, no murmurs / rubs / gallops. No extremity edema. 2+ pedal pulses. No carotid bruits.  Chest:   Nontender without crepitus or deformity.   Back:   Nontender without crepitus or deformity. Abdomen: Abdomen is soft and nontender.  No evidence of intra-abdominal masses.  Positive bowel sounds noted in all quadrants.   Musculoskeletal: No joint deformity upper and lower extremities. Good ROM, no contractures. Normal muscle tone.  Neurologic: CN 2-12 grossly intact.  Notable decrease in sensation over the right face and right side of the body.  Very mild notable weakness of the distal muscle groups of the right upper extremity.  Patient is following all commands.  Patient is responsive to verbal stimuli.   Psychiatric: Patient exhibits normal mood with appropriate affect.  Patient seems to possess insight as to their current situation.     Labs on Admission: I have personally reviewed following labs and imaging studies -   CBC: Recent Labs  Lab 06/16/20 1834 06/16/20 2134  WBC 9.7  --   NEUTROABS 7.3  --   HGB 14.7 16.3*  HCT 45.1 48.0*  MCV 90.7  --   PLT 213  --    Basic Metabolic  Panel: Recent Labs  Lab 06/16/20 1834 06/16/20 2134  NA 131* 136  K 3.9 3.9  CL 99 99  CO2 20*  --   GLUCOSE 530* 445*  BUN 14 17  CREATININE 0.94 0.60  CALCIUM 9.2  --    GFR: Estimated Creatinine Clearance: 56.4 mL/min (by C-G formula based on SCr of 0.6 mg/dL). Liver Function Tests: Recent Labs  Lab 06/16/20 1834  AST 25  ALT 18  ALKPHOS 77  BILITOT 0.5  PROT 6.6  ALBUMIN 3.7   No results for input(s): LIPASE, AMYLASE in the last 168 hours. No results for input(s): AMMONIA in the last 168 hours. Coagulation Profile: Recent Labs  Lab 06/16/20 2123  INR 1.0   Cardiac Enzymes: No results for input(s): CKTOTAL, CKMB, CKMBINDEX, TROPONINI in the last 168 hours. BNP (last 3 results) No results for input(s): PROBNP in the last 8760 hours. HbA1C: No results for input(s): HGBA1C in the last 72 hours. CBG: Recent Labs  Lab 06/17/20 0410  GLUCAP 303*   Lipid Profile: No results for input(s): CHOL, HDL, LDLCALC, TRIG, CHOLHDL, LDLDIRECT in the last 72 hours. Thyroid Function Tests: No results for input(s): TSH, T4TOTAL, FREET4, T3FREE, THYROIDAB in the last 72 hours. Anemia Panel: No results for input(s): VITAMINB12, FOLATE, FERRITIN, TIBC, IRON, RETICCTPCT in the last 72 hours. Urine analysis:    Component Value Date/Time   COLORURINE YELLOW 02/25/2016 1534   APPEARANCEUR CLEAR 02/25/2016 1534   LABSPEC 1.025 02/25/2016 1534   PHURINE 5.5 02/25/2016 1534   GLUCOSEU 100 (A) 02/25/2016 1534   HGBUR NEGATIVE 02/25/2016 1534   BILIRUBINUR NEGATIVE 02/25/2016 1534   KETONESUR NEGATIVE 02/25/2016 1534   PROTEINUR NEGATIVE 02/25/2016 1534   NITRITE NEGATIVE 02/25/2016 1534   LEUKOCYTESUR NEGATIVE 02/25/2016 1534    Radiological Exams on Admission - Personally Reviewed: CT Head Wo Contrast  Result Date: 06/16/2020 CLINICAL DATA:  59 year old female with numbness, tingling, and paresthesia. EXAM: CT HEAD WITHOUT CONTRAST TECHNIQUE: Contiguous axial images were  obtained from the base of the skull through the vertex without intravenous contrast. COMPARISON:  None. FINDINGS: Brain: The ventricles and sulci appropriate size for patient's age. There is a 1.3 x 0.9 cm hypodense area in the left thalamus consistent with an infarct, likely acute or subacute. Clinical correlation and further evaluation with MRI is recommended. 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 There is no acute intracranial hemorrhage. No mass effect or midline shift. No extra-axial fluid collection. Vascular: No hyperdense vessel or unexpected calcification. Skull: Normal. Negative for fracture or focal lesion. Sinuses/Orbits: No acute finding. Other: None IMPRESSION: 1. Left thalamic infarct, likely acute or subacute. 2. No acute intracranial hemorrhage. 3. 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 These results were called by telephone at the time of interpretation on 06/16/2020 at 8:21 pm to provider [facility] , who verbally acknowledged these results. Electronically Signed   By: Elgie Collard M.D.   On: 06/16/2020 20:28   MR ANGIO HEAD WO CONTRAST  Result Date: 06/16/2020 CLINICAL DATA:  Acute neurologic deficit EXAM: MR HEAD WITHOUT CONTRAST MR CIRCLE OF WILLIS WITHOUT CONTRAST MRA OF THE NECK WITHOUT AND WITH CONTRAST TECHNIQUE: Multiplanar, multiecho pulse sequences of the brain, circle of willis and surrounding structures were obtained without intravenous contrast. Angiographic images of the neck were obtained using MRA technique without and with intravenous contrast. CONTRAST:  5.46mL GADAVIST GADOBUTROL 1 MMOL/ML IV SOLN COMPARISON:  None. FINDINGS: MR HEAD FINDINGS Brain: Small focus of acute ischemia in the left thalamus. Multifocal hyperintense T2-weighted signal within the white matter. There is an old right occipital cortical infarct. Multiple old corona radiata small vessel infarcts. Normal volume of CSF spaces. No chronic microhemorrhage. Normal midline  structures. Vascular: Major flow voids are preserved. Skull and upper cervical spine: Normal calvarium and skull base. Visualized upper cervical spine and soft tissues are normal. Sinuses/Orbits:No paranasal sinus fluid levels or advanced mucosal thickening. No mastoid or middle ear effusion. Normal orbits. MR CIRCLE OF WILLIS FINDINGS POSTERIOR CIRCULATION: --Vertebral arteries: Normal --Inferior cerebellar arteries: Normal. --Basilar artery: Normal. --Superior cerebellar arteries: Normal. --Posterior cerebral arteries: Normal. ANTERIOR CIRCULATION: --Intracranial internal carotid arteries: Normal. --Anterior cerebral arteries (ACA): Normal. --Middle cerebral arteries (MCA): Normal. ANATOMIC VARIANTS: Fetal origin of the left PCA. Both P comm's are present. MRA NECK FINDINGS There is an aberrant right subclavian artery. The visualized subclavian arteries are both patent. There is no carotid artery stenosis. The vertebral arteries are left-dominant. Both origins are clearly patent. There is no stenosis or  other abnormality. IMPRESSION: 1. Small focus of acute ischemia in the left thalamus. No hemorrhage or mass effect. 2. Old right occipital cortical infarct and multiple old corona radiata small vessel infarcts. 3. Normal MRA of the head and neck. Electronically Signed   By: Deatra Robinson M.D.   On: 06/16/2020 22:49   MR ANGIO NECK W WO CONTRAST  Result Date: 06/16/2020 CLINICAL DATA:  Acute neurologic deficit EXAM: MR HEAD WITHOUT CONTRAST MR CIRCLE OF WILLIS WITHOUT CONTRAST MRA OF THE NECK WITHOUT AND WITH CONTRAST TECHNIQUE: Multiplanar, multiecho pulse sequences of the brain, circle of willis and surrounding structures were obtained without intravenous contrast. Angiographic images of the neck were obtained using MRA technique without and with intravenous contrast. CONTRAST:  5.80mL GADAVIST GADOBUTROL 1 MMOL/ML IV SOLN COMPARISON:  None. FINDINGS: MR HEAD FINDINGS Brain: Small focus of acute ischemia in  the left thalamus. Multifocal hyperintense T2-weighted signal within the white matter. There is an old right occipital cortical infarct. Multiple old corona radiata small vessel infarcts. Normal volume of CSF spaces. No chronic microhemorrhage. Normal midline structures. Vascular: Major flow voids are preserved. Skull and upper cervical spine: Normal calvarium and skull base. Visualized upper cervical spine and soft tissues are normal. Sinuses/Orbits:No paranasal sinus fluid levels or advanced mucosal thickening. No mastoid or middle ear effusion. Normal orbits. MR CIRCLE OF WILLIS FINDINGS POSTERIOR CIRCULATION: --Vertebral arteries: Normal --Inferior cerebellar arteries: Normal. --Basilar artery: Normal. --Superior cerebellar arteries: Normal. --Posterior cerebral arteries: Normal. ANTERIOR CIRCULATION: --Intracranial internal carotid arteries: Normal. --Anterior cerebral arteries (ACA): Normal. --Middle cerebral arteries (MCA): Normal. ANATOMIC VARIANTS: Fetal origin of the left PCA. Both P comm's are present. MRA NECK FINDINGS There is an aberrant right subclavian artery. The visualized subclavian arteries are both patent. There is no carotid artery stenosis. The vertebral arteries are left-dominant. Both origins are clearly patent. There is no stenosis or other abnormality. IMPRESSION: 1. Small focus of acute ischemia in the left thalamus. No hemorrhage or mass effect. 2. Old right occipital cortical infarct and multiple old corona radiata small vessel infarcts. 3. Normal MRA of the head and neck. Electronically Signed   By: Deatra Robinson M.D.   On: 06/16/2020 22:49   MR BRAIN WO CONTRAST  Result Date: 06/16/2020 CLINICAL DATA:  Acute neurologic deficit EXAM: MR HEAD WITHOUT CONTRAST MR CIRCLE OF WILLIS WITHOUT CONTRAST MRA OF THE NECK WITHOUT AND WITH CONTRAST TECHNIQUE: Multiplanar, multiecho pulse sequences of the brain, circle of willis and surrounding structures were obtained without intravenous  contrast. Angiographic images of the neck were obtained using MRA technique without and with intravenous contrast. CONTRAST:  5.34mL GADAVIST GADOBUTROL 1 MMOL/ML IV SOLN COMPARISON:  None. FINDINGS: MR HEAD FINDINGS Brain: Small focus of acute ischemia in the left thalamus. Multifocal hyperintense T2-weighted signal within the white matter. There is an old right occipital cortical infarct. Multiple old corona radiata small vessel infarcts. Normal volume of CSF spaces. No chronic microhemorrhage. Normal midline structures. Vascular: Major flow voids are preserved. Skull and upper cervical spine: Normal calvarium and skull base. Visualized upper cervical spine and soft tissues are normal. Sinuses/Orbits:No paranasal sinus fluid levels or advanced mucosal thickening. No mastoid or middle ear effusion. Normal orbits. MR CIRCLE OF WILLIS FINDINGS POSTERIOR CIRCULATION: --Vertebral arteries: Normal --Inferior cerebellar arteries: Normal. --Basilar artery: Normal. --Superior cerebellar arteries: Normal. --Posterior cerebral arteries: Normal. ANTERIOR CIRCULATION: --Intracranial internal carotid arteries: Normal. --Anterior cerebral arteries (ACA): Normal. --Middle cerebral arteries (MCA): Normal. ANATOMIC VARIANTS: Fetal origin of the left PCA. Both P comm's  are present. MRA NECK FINDINGS There is an aberrant right subclavian artery. The visualized subclavian arteries are both patent. There is no carotid artery stenosis. The vertebral arteries are left-dominant. Both origins are clearly patent. There is no stenosis or other abnormality. IMPRESSION: 1. Small focus of acute ischemia in the left thalamus. No hemorrhage or mass effect. 2. Old right occipital cortical infarct and multiple old corona radiata small vessel infarcts. 3. Normal MRA of the head and neck. Electronically Signed   By: Deatra Robinson M.D.   On: 06/16/2020 22:49    EKG: Personally reviewed.  Rhythm is normal sinus rhythm with heart rate of 68 bpm.  No  dynamic ST segment changes appreciated.  Assessment/Plan Principal Problem:   Arterial ischemic stroke, vertebrobasilar, thalamic, acute, left (HCC)   Patient found to have a small focus of acute ischemic infarct notable on MRI brain  Patient has multiple risk factors including prior stroke, uncontrolled diabetes mellitus and hypertension  Furthermore, patient suddenly stopped her statin therapy approximately 1 year ago increasing her risk further  Dr. Iver Nestle with neurology has seen the patient and is provided recommendations, her assistance is appreciated.  Initiating aspirin 325 mg followed by 81 mg daily  Initiating 3 mg of Plavix followed by 75 mg daily for total of 21 days  Obtaining lipid panel and initiating high intensity statin therapy  Obtaining PT, OT and SLP evaluations  Obtaining echocardiography  Monitoring patient on telemetry  Patient may benefit from event monitor after discharge if no evidence of arrhythmias identified during this hospitalization  Active Problems:   Type 2 diabetes mellitus with diabetic polyneuropathy, with long-term current use of insulin (HCC)   Patient presented to the emergency department substantial hyperglycemia without diabetic ketoacidosis  Patient was provided with repeated doses of subcutaneous insulin throughout the evening to achieve glycemic control  Resuming home regimen of basal insulin therapy  Accu-Cheks before every meal and nightly with sliding scale insulin  Hemoglobin A1c pending    Essential hypertension   Continue home regimen of antihypertensive therapy  Will titrate therapy to achieve normotension per neurology recommendations    Diabetic polyneuropathy associated with type 2 diabetes mellitus (HCC)   Continue home regimen of gabapentin    Nicotine dependence, cigarettes, uncomplicated    Counseling patient daily on smoking cessation   Code Status:  Full code Family Communication: deferred    Status is: Observation  The patient remains OBS appropriate and will d/c before 2 midnights.  Dispo: The patient is from: Home              Anticipated d/c is to: Home              Anticipated d/c date is: 2 days              Patient currently is not medically stable to d/c.        Marinda Elk MD [facility] Pager [phone]  If 7PM-7AM, please contact night-coverage [email or web address] Use universal [employer] password for that web site. If you do not have the password, please call the hospital operator.  06/17/2020, 7:19 AM

## 2020-06-18 DIAGNOSIS — E1142 Type 2 diabetes mellitus with diabetic polyneuropathy: Secondary | ICD-10-CM

## 2020-06-18 DIAGNOSIS — F1910 Other psychoactive substance abuse, uncomplicated: Secondary | ICD-10-CM

## 2020-06-18 DIAGNOSIS — R7309 Other abnormal glucose: Secondary | ICD-10-CM

## 2020-06-18 DIAGNOSIS — Z794 Long term (current) use of insulin: Secondary | ICD-10-CM

## 2020-06-18 DIAGNOSIS — I6930 Unspecified sequelae of cerebral infarction: Secondary | ICD-10-CM

## 2020-06-18 DIAGNOSIS — E1165 Type 2 diabetes mellitus with hyperglycemia: Secondary | ICD-10-CM

## 2020-06-18 LAB — CBC
HCT: 39.1 % (ref 36.0–46.0)
Hemoglobin: 13 g/dL (ref 12.0–15.0)
MCH: 30.1 pg (ref 26.0–34.0)
MCHC: 33.2 g/dL (ref 30.0–36.0)
MCV: 90.5 fL (ref 80.0–100.0)
Platelets: 202 10*3/uL (ref 150–400)
RBC: 4.32 MIL/uL (ref 3.87–5.11)
RDW: 13.6 % (ref 11.5–15.5)
WBC: 8.4 10*3/uL (ref 4.0–10.5)
nRBC: 0 % (ref 0.0–0.2)

## 2020-06-18 LAB — GLUCOSE, CAPILLARY
Glucose-Capillary: 179 mg/dL — ABNORMAL HIGH (ref 70–99)
Glucose-Capillary: 329 mg/dL — ABNORMAL HIGH (ref 70–99)
Glucose-Capillary: 431 mg/dL — ABNORMAL HIGH (ref 70–99)
Glucose-Capillary: 442 mg/dL — ABNORMAL HIGH (ref 70–99)
Glucose-Capillary: 315 mg/dL — ABNORMAL HIGH (ref 70–99)
Glucose-Capillary: 72 mg/dL (ref 70–99)
Glucose-Capillary: 108 mg/dL — ABNORMAL HIGH (ref 70–99)
Glucose-Capillary: 228 mg/dL — ABNORMAL HIGH (ref 70–99)

## 2020-06-18 LAB — BASIC METABOLIC PANEL
Anion gap: 11 (ref 5–15)
BUN: 16 mg/dL (ref 6–20)
CO2: 22 mmol/L (ref 22–32)
Calcium: 8.9 mg/dL (ref 8.9–10.3)
Chloride: 104 mmol/L (ref 98–111)
Creatinine, Ser: 0.84 mg/dL (ref 0.44–1.00)
GFR, Estimated: >60 mL/min (ref >60)
Glucose, Bld: 215 mg/dL — ABNORMAL HIGH (ref 70–99)
Potassium: 3.9 mmol/L (ref 3.5–5.1)
Sodium: 137 mmol/L (ref 135–145)

## 2020-06-18 LAB — MAGNESIUM: Magnesium: 1.8 mg/dL (ref 1.7–2.4)

## 2020-06-18 LAB — GLUCOSE, RANDOM: Glucose, Bld: 424 mg/dL — ABNORMAL HIGH (ref 70–99)

## 2020-06-18 MED ORDER — INSULIN GLARGINE 100 UNIT/ML ~~LOC~~ SOLN
25.0000 [IU] | Freq: Every day | SUBCUTANEOUS | Status: DC
  Administered 2020-06-19: 25 [IU] via SUBCUTANEOUS
  Filled 2020-06-18: qty 0.25

## 2020-06-18 MED ORDER — INSULIN ASPART 100 UNIT/ML ~~LOC~~ SOLN
25.0000 [IU] | Freq: Once | SUBCUTANEOUS | Status: AC
  Administered 2020-06-18: 25 [IU] via SUBCUTANEOUS

## 2020-06-18 MED ORDER — INSULIN ASPART 100 UNIT/ML ~~LOC~~ SOLN
30.0000 [IU] | SUBCUTANEOUS | Status: AC
  Administered 2020-06-18: 30 [IU] via SUBCUTANEOUS

## 2020-06-18 MED ORDER — INSULIN GLARGINE 100 UNIT/ML ~~LOC~~ SOLN
5.0000 [IU] | Freq: Once | SUBCUTANEOUS | Status: AC
  Administered 2020-06-18: 5 [IU] via SUBCUTANEOUS
  Filled 2020-06-18: qty 0.05

## 2020-06-18 NOTE — Hospital Course (Signed)
59 year old with history of chronic low back pain, DM2, HTN, peripheral neuropathy, previous CVA 2017 came to [facility] with complaints of right-sided numbness and tingling all day.  She had some associated weakness and a headache prior to admission as well. CT head showed possible left subacute/acute thalamic infarct, neurology team was consulted.  Started on aspirin and Plavix. MRI brain showed an acute left-sided thalamic infarct and old right-sided hemispheric infarct.  MRA of the head and neck was unremarkable. Of note, UDS was also positive for [facility] and cocaine on admission.  Patient did deny personal cocaine use. She was evaluated by neurology on admission.  She was recommended to continue on aspirin and Plavix for 3 weeks then Plavix alone.

## 2020-06-18 NOTE — Progress Notes (Signed)
Inpatient Rehab Admissions:  Inpatient Rehab Consult received. I spoke to the patient over the phone for rehabilitation assessment and to discuss goals and expectations of an inpatient rehab admission.  We discussed that average length of stay for CIR is about 2 weeks, and that patients usually have goals of supervision at discharge.  She does have 4 daughters who are calling to discuss discharge plans this afternoon.  I am not sure pt will need the intensity of CIR, as she is already supervision level for ADLs and min assist or better with mobility.  I will f/u with the patient later this afternoon or tomorrow AM.   Signed: Estill Dooms, PT, DPT Admissions Coordinator [phone] 06/18/20  1:15 PM

## 2020-06-18 NOTE — Progress Notes (Addendum)
Inpatient Diabetes Program Recommendations  AACE/ADA: New Consensus Statement on Inpatient Glycemic Control (2015)  Target Ranges:  Prepandial:   less than 140 mg/dL      Peak postprandial:   less than 180 mg/dL (1-2 hours)      Critically ill patients:  140 - 180 mg/dL   Lab Results  Component Value Date   GLUCAP 329 (H) 06/18/2020   HGBA1C 9.5 (H) 06/16/2020    Review of Glycemic Control Results for ANYAH, SWALLOW (MRN [id number]) as of 06/18/2020 11:26  Ref. Range 06/17/2020 07:50 06/17/2020 13:40 06/17/2020 17:32 06/17/2020 20:55 06/18/2020 01:58 06/18/2020 06:48  Glucose-Capillary Latest Ref Range: 70 - 99 mg/dL 683 (H) 419 (H) 622 (H) 262 (H) 179 (H) 329 (H)   Diabetes history:  DM2 Outpatient Diabetes medications:  Lantus 40 units daily Glipizide 5 mg BID Current orders for Inpatient glycemic control:  Novolog 020 units tid & HS Lantus 20 QAM  Inpatient Diabetes Program Recommendations:     Please consider increasing Lantus to 30 units QAM (would administer 10 additional units now as patient has received am dose of 20 units)  Note:  Spoke with patient at bedside.  She confirms above home medications for her diabetes.  She denies difficulty obtaining them and she administers as prescribed.  Reviewed patient's current A1c of 9.5% (average blood sugar of 226 mg/dL). Explained what a A1c is and what it measures. Also reviewed goal A1c with patient, importance of good glucose control @ home, and blood sugar goals.  She states her PCP wanted her to increase her Lantus to 50 units daily and she never increased dose.  She last saw Dr. Hart Robinsons in October.  She admits to not following a carb modified diet and drinks juice.  Educated her on CHO's and goal CHO's per meal.  Encouraged her to eliminate beverages containing sugar.  She checks her blood sugar 2 x a day.  She rarely has hypoglycemia and is aware how to treat as well as signs and symptoms.  Ordered her the Living Well with Diabetes  booklet.    Addendum@ 1434  CBG is 442 mg/dl after receiving 25 units of Novolog at 1230.  She did eat lunch and likely needs meal coverage if eats at least 50%-Novolog 3-4 units tid. Would also recommend obtaining a BMET if glucose remains >400. Recommend Correction Q4H while CBG's are >400.    Will continue to follow while inpatient.  Thank you, Dulce Sellar, RN, BSN Diabetes Coordinator Inpatient Diabetes Program [phone] (team pager from 8a-5p)

## 2020-06-18 NOTE — Progress Notes (Signed)
Physical Therapy Treatment Patient Details Name: Kim Boyd MRN: [id number] DOB: 02-Dec-1960 Today's Date: 06/18/2020    History of Present Illness Pt is a 59 y/o female admitted secondary to R sided weakness and numbness. 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 PMH includes HTN, HTN, and CVA.     PT Comments    Pt tolerates treatment well but does fatigue quickly during ambulation. Pt continues to report R sided numbness with RLE numbness more significant than RUE. PT observes mild R knee buckling during ambulation this session. Pt utilizing BUE support of RW to prevent loss of balance. PT continues to recommend CIR placement as the pt typically lives alone and is very independent. The pt demonstrates the potential to return to independence and will benefit from high intensity inpatient therapies to achieve these goals.  Follow Up Recommendations  CIR     Equipment Recommendations  None recommended by PT    Recommendations for Other Services       Precautions / Restrictions Precautions Precautions: Fall Restrictions Weight Bearing Restrictions: No    Mobility  Bed Mobility Overal bed mobility: Needs Assistance Bed Mobility: Supine to Sit;Sit to Supine     Supine to sit: Supervision;HOB elevated Sit to supine: Supervision;HOB elevated      Transfers Overall transfer level: Needs assistance Equipment used: Rolling walker (2 wheeled) Transfers: Sit to/from Stand Sit to Stand: Min guard            Ambulation/Gait Ambulation/Gait assistance: Editor, commissioning (Feet): 25 Feet (additional trial of 15') Assistive device: Rolling walker (2 wheeled) Gait Pattern/deviations: Step-to pattern;Decreased stance time - right Gait velocity: reduced Gait velocity interpretation: <1.31 ft/sec, indicative of household ambulator General Gait Details: pt with short step-to gait, intermittent increase in R knee flexion during stance phase. Fatigues quickly, increased  trunk flexion   Stairs             Wheelchair Mobility    Modified Rankin (Stroke Patients Only) Modified Rankin (Stroke Patients Only) Pre-Morbid Rankin Score: No symptoms Modified Rankin: Moderately severe disability     Balance Overall balance assessment: Needs assistance Sitting-balance support: No upper extremity supported;Feet supported Sitting balance-Leahy Scale: Good Sitting balance - Comments: supervision to don socks sitting up in bed   Standing balance support: Bilateral upper extremity supported;Single extremity supported Standing balance-Leahy Scale: Poor Standing balance comment: reliant on UE support of RW                            Cognition Arousal/Alertness: Awake/alert Behavior During Therapy: WFL for tasks assessed/performed Overall Cognitive Status: Within Functional Limits for tasks assessed                                        Exercises      General Comments General comments (skin integrity, edema, etc.): VSS on RA, pt fatigues quickly      Pertinent Vitals/Pain Pain Assessment: No/denies pain    Home Living     Available Help at Discharge: Family;Available PRN/intermittently Type of Home: Apartment              Prior Function            PT Goals (current goals can now be found in the care plan section) Acute Rehab PT Goals Patient Stated Goal: to get stronger Progress towards PT goals:  Progressing toward goals    Frequency    Min 4X/week      PT Plan Current plan remains appropriate    Co-evaluation              AM-PAC PT "6 Clicks" Mobility   Outcome Measure  Help needed turning from your back to your side while in a flat bed without using bedrails?: None Help needed moving from lying on your back to sitting on the side of a flat bed without using bedrails?: None Help needed moving to and from a bed to a chair (including a wheelchair)?: A Little Help needed standing up  from a chair using your arms (e.g., wheelchair or bedside chair)?: A Little Help needed to walk in hospital room?: A Little Help needed climbing 3-5 steps with a railing? : Total 6 Click Score: 18    End of Session Equipment Utilized During Treatment: Gait belt Activity Tolerance: Patient limited by fatigue (limiting ambulation tolerance) Patient left: in chair;with call bell/phone within reach;with chair alarm set Nurse Communication: Mobility status PT Visit Diagnosis: Unsteadiness on feet (R26.81);Muscle weakness (generalized) (M62.81);Difficulty in walking, not elsewhere classified (R26.2)     Time: 2440-1027 PT Time Calculation (min) (ACUTE ONLY): 19 min  Charges:  $Therapeutic Activity: 8-22 mins                     Arlyss Gandy, PT, DPT Acute Rehabilitation Pager: [phone]    Arlyss Gandy 06/18/2020, 9:34 AM

## 2020-06-18 NOTE — Progress Notes (Signed)
PROGRESS NOTE    Kim Boyd   HKV:[id number]  DOB: 10-16-1960  DOA: 06/16/2020     1  PCP: Renaldo Harrison, DO  CC: right side numbness/weakness  Hospital Course: 59 year old with history of chronic low back pain, DM2, HTN, peripheral neuropathy, previous CVA 2017 came to [facility] with complaints of right-sided numbness and tingling all day.  She had some associated weakness and a headache prior to admission as well. CT head showed possible left subacute/acute thalamic infarct, neurology team was consulted.  Started on aspirin and Plavix. MRI brain showed an acute left-sided thalamic infarct and old right-sided hemispheric infarct.  MRA of the head and neck was unremarkable. Of note, UDS was also positive for [employer] and cocaine on admission.  Patient did deny personal cocaine use. She was evaluated by neurology on admission.  She was recommended to continue on aspirin and Plavix for 3 weeks then Plavix alone.   Interval History:  Feeling better today she says.  Still having some right-sided decreased sensation but denies any further weakness.  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  We will follow up on further evaluations  HTN - continue lisinopril and hctz  HLD - continue crestor  DMII - A1c 9.5% - continue Lantus and SSI - CBGs over goal today; will adjust coverage as necessary  Tobacco abuse - ongoing  cessation recommended  Substance use - UDS positive for cocaine; patient denies personal use (was around someone who smoked it?)   Antimicrobials: none  DVT prophylaxis: SCD Code Status: Full Family Communication: none present Disposition Plan: Status is: Inpatient  Remains inpatient appropriate because:IV treatments appropriate due to intensity of illness or inability to take PO and Inpatient level of care appropriate due to severity of illness   Dispo: The patient is from: Home              Anticipated d/c is to: Home              Anticipated d/c date is: 1 day              Patient currently is not medically stable to d/c.       Objective: Blood pressure 107/90, pulse 82, temperature 97.8 F (36.6 C), temperature source Oral, resp. rate 18, height 5\' 1"  (1.549 m), weight 56.7 kg, SpO2 97 %.  Examination: General appearance: alert, cooperative and no distress Head: Normocephalic, without obvious abnormality, atraumatic Eyes: EOMI Lungs: clear to auscultation bilaterally Heart: regular rate and rhythm and S1, S2 normal Abdomen: normal findings: bowel sounds normal and soft, non-tender Extremities: no edema Skin: mobility and turgor normal Neurologic: Right lower extremity paresthesia per patient. No focal weakness  Consultants:   Neurology  Procedures:   None  Data Reviewed: I have personally reviewed following labs and imaging studies Results for orders placed or performed during the hospital encounter of 06/16/20 (from the past 24 hour(s))  CBG monitoring, ED     Status: Abnormal   Collection Time: 06/17/20  5:32 PM  Result  Value Ref Range   Glucose-Capillary 301 (H) 70 - 99 mg/dL  TSH     Status: None   Collection Time: 06/17/20  8:39 PM  Result Value Ref Range   TSH 1.485 0.350 - 4.500 uIU/mL  Glucose, capillary     Status: Abnormal   Collection Time: 06/17/20  8:55 PM  Result Value Ref Range   Glucose-Capillary 262 (H) 70 - 99 mg/dL   Comment 1  Notify RN    Comment 2 Document in Chart   Glucose, capillary     Status: Abnormal   Collection Time: 06/18/20  1:58 AM  Result Value Ref Range   Glucose-Capillary 179 (H) 70 - 99 mg/dL   Comment 1 Notify RN    Comment 2 Document in Chart   Basic metabolic panel     Status: Abnormal   Collection Time: 06/18/20  2:54 AM  Result Value Ref Range   Sodium 137 135 - 145 mmol/L   Potassium 3.9 3.5 - 5.1 mmol/L   Chloride 104 98 - 111 mmol/L   CO2 22 22 - 32 mmol/L   Glucose, Bld 215 (H) 70 - 99 mg/dL   BUN 16 6 - 20 mg/dL   Creatinine, Ser 1.61 0.44 - 1.00 mg/dL   Calcium 8.9 8.9 - 09.6 mg/dL   GFR, Estimated >04 >54 mL/min   Anion gap 11 5 - 15  CBC     Status: None   Collection Time: 06/18/20  2:54 AM  Result Value Ref Range   WBC 8.4 4.0 - 10.5 K/uL   RBC 4.32 3.87 - 5.11 MIL/uL   Hemoglobin 13.0 12.0 - 15.0 g/dL   HCT 09.8 36 - 46 %   MCV 90.5 80.0 - 100.0 fL   MCH 30.1 26.0 - 34.0 pg   MCHC 33.2 30.0 - 36.0 g/dL   RDW 11.9 14.7 - 82.9 %   Platelets 202 150 - 400 K/uL   nRBC 0.0 0.0 - 0.2 %  Magnesium     Status: None   Collection Time: 06/18/20  2:54 AM  Result Value Ref Range   Magnesium 1.8 1.7 - 2.4 mg/dL  Glucose, capillary     Status: Abnormal   Collection Time: 06/18/20  6:48 AM  Result Value Ref Range   Glucose-Capillary 329 (H) 70 - 99 mg/dL   Comment 1 Notify RN    Comment 2 Document in Chart   Glucose, capillary     Status: Abnormal   Collection Time: 06/18/20 11:38 AM  Result Value Ref Range   Glucose-Capillary 431 (H) 70 - 99 mg/dL  Glucose, random     Status: Abnormal   Collection Time: 06/18/20 12:11 PM  Result Value Ref Range   Glucose, Bld 424 (H) 70 - 99 mg/dL  Glucose, capillary     Status: Abnormal   Collection Time: 06/18/20  2:21 PM  Result Value Ref Range   Glucose-Capillary 442 (H) 70 - 99 mg/dL   Comment 1 Notify RN     Recent Results (from the past 240 hour(s))  Resp Panel by RT-PCR (Flu A&B, Covid) Nasopharyngeal Swab     Status:  None   Collection Time: 06/16/20  9:05 PM   Specimen: Nasopharyngeal Swab; Nasopharyngeal(NP) swabs in vial transport medium  Result Value Ref Range Status   SARS Coronavirus 2 by RT PCR NEGATIVE NEGATIVE Final    Comment: (NOTE) SARS-CoV-2 target nucleic acids are NOT DETECTED.  The SARS-CoV-2 RNA is generally detectable in upper respiratory specimens  during the acute phase of infection. The lowest concentration of SARS-CoV-2 viral copies this assay can detect is 138 copies/mL. A negative result does not preclude SARS-Cov-2 infection and should not be used as the sole basis for treatment or other patient management decisions. A negative result may occur with  improper specimen collection/handling, submission of specimen other than nasopharyngeal swab, presence of viral mutation(s) within the areas targeted by this assay, and inadequate number of viral copies(<138 copies/mL). A negative result must be combined with clinical observations, patient history, and epidemiological information. The expected result is Negative.  Fact Sheet for Patients:  BloggerCourse.com  Fact Sheet for Healthcare Providers:  SeriousBroker.it  This test is no t yet approved or cleared by the [place] FDA and  has been authorized for detection and/or diagnosis of SARS-CoV-2 by FDA under an Emergency Use Authorization (EUA). This EUA will remain  in effect (meaning this test can be used) for the duration of the COVID-19 declaration under Section 564(b)(1) of the Act, 21 U.S.C.section 360bbb-3(b)(1), unless the authorization is terminated  or revoked sooner.       Influenza A by PCR NEGATIVE NEGATIVE Final   Influenza B by PCR NEGATIVE NEGATIVE Final    Comment: (NOTE) The Xpert Xpress SARS-CoV-2/FLU/RSV plus assay is intended as an aid in the diagnosis of influenza from Nasopharyngeal swab specimens and should not be used as a sole basis for  treatment. Nasal washings and aspirates are unacceptable for Xpert Xpress SARS-CoV-2/FLU/RSV testing.  Fact Sheet for Patients: BloggerCourse.com  Fact Sheet for Healthcare Providers: SeriousBroker.it  This test is not yet approved or cleared by the [place] FDA and has been authorized for detection and/or diagnosis of SARS-CoV-2 by FDA under an Emergency Use Authorization (EUA). This EUA will remain in effect (meaning this test can be used) for the duration of the COVID-19 declaration under Section 564(b)(1) of the Act, 21 U.S.C. section 360bbb-3(b)(1), unless the authorization is terminated or revoked.  Performed at [facility], [address], [place], [place] [address]      Radiology Studies: CT Head Wo Contrast  Result Date: 06/16/2020 CLINICAL DATA:  59 year old female with numbness, tingling, and paresthesia. EXAM: CT HEAD WITHOUT CONTRAST TECHNIQUE: Contiguous axial images were obtained from the base of the skull through the vertex without intravenous contrast. COMPARISON:  None. FINDINGS: Brain: The ventricles and sulci appropriate size for patient's age. There is a 1.3 x 0.9 cm hypodense area in the left thalamus consistent with an infarct, likely acute or subacute. Clinical correlation and further evaluation with MRI is recommended. 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 There is no acute intracranial hemorrhage. No mass effect or midline shift. No extra-axial fluid collection. Vascular: No hyperdense vessel or unexpected calcification. Skull: Normal. Negative for fracture or focal lesion. Sinuses/Orbits: No acute finding. Other: None IMPRESSION: 1. Left thalamic infarct, likely acute or subacute. 2. No acute intracranial hemorrhage. 3. 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 These results were called by telephone at the time of interpretation on 06/16/2020 at 8:21 pm to provider [facility] , who  verbally acknowledged these results. Electronically Signed   By: Elgie Collard M.D.   On: 06/16/2020 20:28   MR ANGIO HEAD WO CONTRAST  Result Date: 06/16/2020 CLINICAL DATA:  Acute neurologic deficit EXAM: MR HEAD WITHOUT CONTRAST MR CIRCLE OF WILLIS WITHOUT CONTRAST MRA OF THE NECK WITHOUT AND WITH CONTRAST TECHNIQUE: Multiplanar, multiecho pulse sequences of the brain, circle of willis and surrounding structures were obtained without  intravenous contrast. Angiographic images of the neck were obtained using MRA technique without and with intravenous contrast. CONTRAST:  5.109mL GADAVIST GADOBUTROL 1 MMOL/ML IV SOLN COMPARISON:  None. FINDINGS: MR HEAD FINDINGS Brain: Small focus of acute ischemia in the left thalamus. Multifocal hyperintense T2-weighted signal within the white matter. There is an old right occipital cortical infarct. Multiple old corona radiata small vessel infarcts. Normal volume of CSF spaces. No chronic microhemorrhage. Normal midline structures. Vascular: Major flow voids are preserved. Skull and upper cervical spine: Normal calvarium and skull base. Visualized upper cervical spine and soft tissues are normal. Sinuses/Orbits:No paranasal sinus fluid levels or advanced mucosal thickening. No mastoid or middle ear effusion. Normal orbits. MR CIRCLE OF WILLIS FINDINGS POSTERIOR CIRCULATION: --Vertebral arteries: Normal --Inferior cerebellar arteries: Normal. --Basilar artery: Normal. --Superior cerebellar arteries: Normal. --Posterior cerebral arteries: Normal. ANTERIOR CIRCULATION: --Intracranial internal carotid arteries: Normal. --Anterior cerebral arteries (ACA): Normal. --Middle cerebral arteries (MCA): Normal. ANATOMIC VARIANTS: Fetal origin of the left PCA. Both P comm's are present. MRA NECK FINDINGS There is an aberrant right subclavian artery. The visualized subclavian arteries are both patent. There is no carotid artery stenosis. The vertebral arteries are left-dominant. Both  origins are clearly patent. There is no stenosis or other abnormality. IMPRESSION: 1. Small focus of acute ischemia in the left thalamus. No hemorrhage or mass effect. 2. Old right occipital cortical infarct and multiple old corona radiata small vessel infarcts. 3. Normal MRA of the head and neck. Electronically Signed   By: Deatra Robinson M.D.   On: 06/16/2020 22:49   MR ANGIO NECK W WO CONTRAST  Result Date: 06/16/2020 CLINICAL DATA:  Acute neurologic deficit EXAM: MR HEAD WITHOUT CONTRAST MR CIRCLE OF WILLIS WITHOUT CONTRAST MRA OF THE NECK WITHOUT AND WITH CONTRAST TECHNIQUE: Multiplanar, multiecho pulse sequences of the brain, circle of willis and surrounding structures were obtained without intravenous contrast. Angiographic images of the neck were obtained using MRA technique without and with intravenous contrast. CONTRAST:  5.69mL GADAVIST GADOBUTROL 1 MMOL/ML IV SOLN COMPARISON:  None. FINDINGS: MR HEAD FINDINGS Brain: Small focus of acute ischemia in the left thalamus. Multifocal hyperintense T2-weighted signal within the white matter. There is an old right occipital cortical infarct. Multiple old corona radiata small vessel infarcts. Normal volume of CSF spaces. No chronic microhemorrhage. Normal midline structures. Vascular: Major flow voids are preserved. Skull and upper cervical spine: Normal calvarium and skull base. Visualized upper cervical spine and soft tissues are normal. Sinuses/Orbits:No paranasal sinus fluid levels or advanced mucosal thickening. No mastoid or middle ear effusion. Normal orbits. MR CIRCLE OF WILLIS FINDINGS POSTERIOR CIRCULATION: --Vertebral arteries: Normal --Inferior cerebellar arteries: Normal. --Basilar artery: Normal. --Superior cerebellar arteries: Normal. --Posterior cerebral arteries: Normal. ANTERIOR CIRCULATION: --Intracranial internal carotid arteries: Normal. --Anterior cerebral arteries (ACA): Normal. --Middle cerebral arteries (MCA): Normal. ANATOMIC VARIANTS:  Fetal origin of the left PCA. Both P comm's are present. MRA NECK FINDINGS There is an aberrant right subclavian artery. The visualized subclavian arteries are both patent. There is no carotid artery stenosis. The vertebral arteries are left-dominant. Both origins are clearly patent. There is no stenosis or other abnormality. IMPRESSION: 1. Small focus of acute ischemia in the left thalamus. No hemorrhage or mass effect. 2. Old right occipital cortical infarct and multiple old corona radiata small vessel infarcts. 3. Normal MRA of the head and neck. Electronically Signed   By: Deatra Robinson M.D.   On: 06/16/2020 22:49   MR BRAIN WO CONTRAST  Result Date: 06/16/2020 CLINICAL DATA:  Acute neurologic deficit EXAM: MR HEAD WITHOUT CONTRAST MR CIRCLE OF WILLIS WITHOUT CONTRAST MRA OF THE NECK WITHOUT AND WITH CONTRAST TECHNIQUE: Multiplanar, multiecho pulse sequences of the brain, circle of willis and surrounding structures were obtained without intravenous contrast. Angiographic images of the neck were obtained using MRA technique without and with intravenous contrast. CONTRAST:  5.65mL GADAVIST GADOBUTROL 1 MMOL/ML IV SOLN COMPARISON:  None. FINDINGS: MR HEAD FINDINGS Brain: Small focus of acute ischemia in the left thalamus. Multifocal hyperintense T2-weighted signal within the white matter. There is an old right occipital cortical infarct. Multiple old corona radiata small vessel infarcts. Normal volume of CSF spaces. No chronic microhemorrhage. Normal midline structures. Vascular: Major flow voids are preserved. Skull and upper cervical spine: Normal calvarium and skull base. Visualized upper cervical spine and soft tissues are normal. Sinuses/Orbits:No paranasal sinus fluid levels or advanced mucosal thickening. No mastoid or middle ear effusion. Normal orbits. MR CIRCLE OF WILLIS FINDINGS POSTERIOR CIRCULATION: --Vertebral arteries: Normal --Inferior cerebellar arteries: Normal. --Basilar artery: Normal.  --Superior cerebellar arteries: Normal. --Posterior cerebral arteries: Normal. ANTERIOR CIRCULATION: --Intracranial internal carotid arteries: Normal. --Anterior cerebral arteries (ACA): Normal. --Middle cerebral arteries (MCA): Normal. ANATOMIC VARIANTS: Fetal origin of the left PCA. Both P comm's are present. MRA NECK FINDINGS There is an aberrant right subclavian artery. The visualized subclavian arteries are both patent. There is no carotid artery stenosis. The vertebral arteries are left-dominant. Both origins are clearly patent. There is no stenosis or other abnormality. IMPRESSION: 1. Small focus of acute ischemia in the left thalamus. No hemorrhage or mass effect. 2. Old right occipital cortical infarct and multiple old corona radiata small vessel infarcts. 3. Normal MRA of the head and neck. Electronically Signed   By: Deatra Robinson M.D.   On: 06/16/2020 22:49   ECHOCARDIOGRAM COMPLETE BUBBLE STUDY  Result Date: 06/17/2020    ECHOCARDIOGRAM REPORT   Patient Name:   Kim Boyd Date of Exam: 06/17/2020 Medical Rec #:  [id number]       Height:       61.0 in Accession #:    [id number]      Weight:       125.0 lb Date of Birth:  [birth year]       BSA:          1.547 m Patient Age:    58 years        BP:           140/78 mmHg Patient Gender: F               HR:           74 bpm. Exam Location:  Inpatient Procedure: 2D Echo and Saline Contrast Bubble Study Indications:    stroke 434.91  History:        Patient has no prior history of Echocardiogram examinations.                 Risk Factors:Hypertension and Diabetes.  Sonographer:    Delcie Roch Referring Phys: [id number] Deno Lunger [place] IMPRESSIONS  1. Left ventricular ejection fraction, by estimation, is 55 to 60%. The left ventricle has normal function. The left ventricle has no regional wall motion abnormalities. Left ventricular diastolic parameters are indeterminate.  2. Right ventricular systolic function is normal. The right ventricular size  is normal. Tricuspid regurgitation signal is inadequate for assessing PA pressure.  3. The mitral valve is grossly normal. Trivial mitral valve regurgitation. No evidence of mitral stenosis.  4. The aortic  valve is grossly normal. Aortic valve regurgitation is not visualized. No aortic stenosis is present.  5. The inferior vena cava is normal in size with greater than 50% respiratory variability, suggesting right atrial pressure of 3 mmHg.  6. Agitated saline contrast bubble study was negative, with no evidence of any interatrial shunt. Comparison(s): No prior Echocardiogram. Conclusion(s)/Recommendation(s): Normal biventricular function without evidence of hemodynamically significant valvular heart disease. No intracardiac source of embolism detected on this transthoracic study. A transesophageal echocardiogram is recommended to exclude cardiac source of embolism if clinically indicated. FINDINGS  Left Ventricle: Left ventricular ejection fraction, by estimation, is 55 to 60%. The left ventricle has normal function. The left ventricle has no regional wall motion abnormalities. The left ventricular internal cavity size was normal in size. There is  no left ventricular hypertrophy. Left ventricular diastolic parameters are indeterminate. Right Ventricle: The right ventricular size is normal. No increase in right ventricular wall thickness. Right ventricular systolic function is normal. Tricuspid regurgitation signal is inadequate for assessing PA pressure. Left Atrium: Left atrial size was normal in size. Right Atrium: Right atrial size was normal in size. Pericardium: There is no evidence of pericardial effusion. Presence of pericardial fat pad. Mitral Valve: The mitral valve is grossly normal. Trivial mitral valve regurgitation. No evidence of mitral valve stenosis. Tricuspid Valve: The tricuspid valve is grossly normal. Tricuspid valve regurgitation is trivial. No evidence of tricuspid stenosis. Aortic Valve: The  aortic valve is grossly normal. Aortic valve regurgitation is not visualized. No aortic stenosis is present. Pulmonic Valve: The pulmonic valve was not well visualized. Pulmonic valve regurgitation is not visualized. Aorta: The aortic root, ascending aorta and aortic arch are all structurally normal, with no evidence of dilitation or obstruction. Venous: The inferior vena cava is normal in size with greater than 50% respiratory variability, suggesting right atrial pressure of 3 mmHg. IAS/Shunts: The atrial septum is grossly normal. Agitated saline contrast was given intravenously to evaluate for intracardiac shunting. Agitated saline contrast bubble study was negative, with no evidence of any interatrial shunt.  LEFT VENTRICLE PLAX 2D LVIDd:         4.70 cm  Diastology LVIDs:         3.30 cm  LV e' medial:    5.22 cm/s LV PW:         1.00 cm  LV E/e' medial:  17.8 LV IVS:        1.10 cm  LV e' lateral:   6.42 cm/s LVOT diam:     2.00 cm  LV E/e' lateral: 14.4 LV SV:         58 LV SV Index:   37 LVOT Area:     3.14 cm  RIGHT VENTRICLE             IVC RV S prime:     12.20 cm/s  IVC diam: 1.30 cm TAPSE (M-mode): 1.8 cm LEFT ATRIUM             Index       RIGHT ATRIUM           Index LA diam:        3.60 cm 2.33 cm/m  RA Area:     11.60 cm LA Vol (A2C):   33.7 ml 21.79 ml/m RA Volume:   24.80 ml  16.03 ml/m LA Vol (A4C):   36.9 ml 23.86 ml/m LA Biplane Vol: 35.7 ml 23.08 ml/m  AORTIC VALVE LVOT Vmax:   93.80 cm/s LVOT Vmean:  60.200 cm/s LVOT VTI:    0.184 m  AORTA Ao Root diam: 3.10 cm Ao Asc diam:  3.00 cm MITRAL VALVE MV Area (PHT): 3.48 cm     SHUNTS MV Decel Time: 218 msec     Systemic VTI:  0.18 m MV E velocity: 92.70 cm/s   Systemic Diam: 2.00 cm MV A velocity: 110.00 cm/s MV E/A ratio:  0.84 Jodelle Red MD Electronically signed by Jodelle Red MD Signature Date/Time: 06/17/2020/1:34:44 PM    Final    MR BRAIN WO CONTRAST  Final Result    MR ANGIO HEAD WO CONTRAST  Final Result      MR ANGIO NECK W WO CONTRAST  Final Result    CT Head Wo Contrast  Final Result      Scheduled Meds: .  stroke: mapping our early stages of recovery book   Does not apply Once  . amitriptyline  25 mg Oral Daily  . aspirin  81 mg Oral Daily  . clopidogrel  75 mg Oral Daily  . gabapentin  600 mg Oral TID  . insulin aspart  0-20 Units Subcutaneous TID AC & HS  . [START ON 06/19/2020] insulin glargine  25 Units Subcutaneous Daily  . ipratropium-albuterol  3 mL Nebulization BID  . loratadine  10 mg Oral Daily  . rosuvastatin  20 mg Oral Daily   PRN Meds: acetaminophen **OR** acetaminophen (TYLENOL) oral liquid 160 mg/5 mL **OR** acetaminophen, dextromethorphan-guaiFENesin, ipratropium-albuterol Continuous Infusions: . sodium chloride Stopped (06/17/20 0721)     LOS: 1 day  Time spent: Greater than 50% of the 35 minute visit was spent in counseling/coordination of care for the patient as laid out in the A&P.   Lewie Chamber, MD [facility] 06/18/2020, 2:27 PM

## 2020-06-18 NOTE — Consult Note (Signed)
Physical Medicine and Rehabilitation Consult Reason for Consult: Right side weakness Referring Physician: [facility]  HPI: Kim Boyd is a 59 y.o. 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  History taken from chart review and patient.  Patient states she lives alone, but states that she is discussing with her insurance so that someone can stay with her when she is discharged.  She states that if her insurance denied her request she is going to contact social services so that someone will stay with her all the time.  1 level apartment.  Independent prior to admission.  Daughters lives nearby.  She presented on 06/17/2020 with right hemiparesis and numbness.  Cranial CT scan showed left thalamic infarction likely acute or subacute.  No acute intracranial hemorrhage.  Old right occipital infarct encephalomalacia.  Patient did not receive TPA.  MRI of the brain showed small focus of acute ischemia in the left thalamus.  Normal MRA of the neck.  Admission chemistry sodium 131, glucose 530, urine drug screen positive for cocaine and marijuana.  Echocardiogram with ejection fraction of 55-60%, no wall motion abnormalities.  Patient currently is maintained on aspirin and Plavix for CVA prophylaxis x3 weeks then Plavix alone. Tolerating a regular consistency diet.  Hospital course further complicated by associated extremely labile blood glucose.  Therapy evaluations completed with recommendations of physical medicine rehab consult.  Review of Systems  Constitutional: Positive for malaise/fatigue. Negative for chills and fever.  HENT: Negative for hearing loss.   Eyes: Negative for blurred vision and double vision.  Respiratory: Negative for cough and shortness of breath.   Cardiovascular: Negative for chest pain, palpitations and leg  swelling.  Gastrointestinal: Positive for constipation. Negative for heartburn, nausea and vomiting.  Genitourinary: Negative for dysuria and hematuria.  Musculoskeletal: Positive for myalgias.  Skin: Negative for rash.  Neurological: Positive for weakness.  All other systems reviewed and are negative.  Past Medical History:  Diagnosis Date  . Diabetes mellitus without complication (HCC)   . Hypertension   . Stroke [facility])    April 2017   Past Surgical History:  Procedure Laterality Date  . CESAREAN SECTION     No pertinent family history of premature CVA. Social History:  reports that she has been smoking cigarettes. She has been smoking about 0.50 packs per day. She has never used smokeless tobacco. She reports current alcohol use. She reports current drug use. Allergies:  Allergies  Allergen Reactions  . Other Anaphylaxis, Rash and Hives    Allergy to enzyme injected in animals   Medications Prior to Admission  Medication Sig Dispense Refill  . amitriptyline (ELAVIL) 25 MG tablet Take 1 tablet by mouth daily.    Marland Kitchen aspirin 81 MG EC tablet Take 1 tablet by mouth daily.    Marland Kitchen gabapentin (NEURONTIN) 600 MG tablet Take 600 mg by mouth 3 (three) times daily.    Marland Kitchen glipiZIDE (GLUCOTROL) 5 MG tablet Take 5 mg by mouth 2 (two) times daily.    . hydrochlorothiazide (MICROZIDE) 12.5 MG capsule Take 12.5 mg by mouth daily.    Marland Kitchen LANTUS 100 UNIT/ML injection Inject 40 Units into the skin daily.   0  . levocetirizine (XYZAL) 5 MG tablet Take 5 mg by mouth daily.    Marland Kitchen lisinopril (ZESTRIL) 20 MG tablet Take 20 mg by mouth daily.    . rosuvastatin (  CRESTOR) 10 MG tablet Take 10 mg by mouth daily.    Marland Kitchen gabapentin (NEURONTIN) 300 MG capsule Take 300 mg by mouth 3 (three) times daily. (Patient not taking: Reported on 06/17/2020)    . JANUMET 50-1000 MG tablet Take 1 tablet by mouth 2 (two) times daily. (Patient not taking: Reported on 06/16/2020)      Home: Home Living Family/patient expects to  be discharged to:: Private residence Living Arrangements: Alone Available Help at Discharge: Family, Available PRN/intermittently Type of Home: Apartment Home Access: Level entry Home Layout: One level Bathroom Shower/Tub: Engineer, manufacturing systems: Standard Home Equipment: Environmental consultant - 2 wheels, Cane - single point, Tub bench, Bedside commode, Grab bars - toilet, Grab bars - tub/shower (5 bars in the bathroom) Additional Comments: daughter lives nearby/ uses transportation through insurance and requires more than week ahead of time  Lives With: Alone  Functional History: Prior Function Level of Independence: Independent Functional Status:  Mobility: Bed Mobility Overal bed mobility: Modified Independent Bed Mobility: Supine to Sit, Sit to Supine Supine to sit: Supervision, HOB elevated Sit to supine: Supervision, HOB elevated General bed mobility comments: Min A for assist with trunk and LEs.  Transfers Overall transfer level: Needs assistance Equipment used: Rolling walker (2 wheeled) Transfers: Sit to/from Stand Sit to Stand: Supervision General transfer comment: Min A for steadying assist to stand.  Ambulation/Gait Ambulation/Gait assistance: Min assist Gait Distance (Feet): 25 Feet (additional trial of 15') Assistive device: Rolling walker (2 wheeled) Gait Pattern/deviations: Step-to pattern, Decreased stance time - right General Gait Details: pt with short step-to gait, intermittent increase in R knee flexion during stance phase. Fatigues quickly, increased trunk flexion Gait velocity: reduced Gait velocity interpretation: <1.31 ft/sec, indicative of household ambulator    ADL: ADL Overall ADL's : Needs assistance/impaired Eating/Feeding: Set up, Bed level Eating/Feeding Details (indicate cue type and reason): nearly spilling cup attempting to place straw. pt states i poured syrup all over myself this morning Grooming: Wash/dry hands, Education officer, community: Supervision/safety, RW, Regular Toilet, Associate Professor and Hygiene: Supervision/safety  Cognition: Cognition Overall Cognitive Status: Within Functional Limits for tasks assessed Arousal/Alertness: Awake/alert Orientation Level: Oriented X4 Memory: Appears intact Awareness: Appears intact Problem Solving: Appears intact Executive Function: Organizing Organizing: Appears intact Safety/Judgment: Appears intact Cognition Arousal/Alertness: Awake/alert Behavior During Therapy: WFL for tasks assessed/performed Overall Cognitive Status: Within Functional Limits for tasks assessed  Blood pressure 107/90, pulse 82, temperature 97.8 F (36.6 C), temperature source Oral, resp. rate 18, height  (1.549 m), weight 56.7 kg, SpO2 97 %. Physical Exam Vitals reviewed.  Constitutional:      General: She is not in acute distress.    Appearance: She is normal weight. She is not ill-appearing.  HENT:     Head: Normocephalic and atraumatic.     Right Ear: External ear normal.     Left Ear: External ear normal.     Nose: Nose normal.  Eyes:     General:        Right eye: No discharge.        Left eye: No discharge.     Extraocular Movements: Extraocular movements intact.  Cardiovascular:     Rate and Rhythm: Normal rate and regular rhythm.  Pulmonary:     Effort: Pulmonary effort is normal. No respiratory distress.     Breath sounds: No stridor.  Abdominal:     General: Abdomen is flat. Bowel sounds are normal. There is no distension.  Musculoskeletal:  Cervical back: Normal range of motion and neck supple.     Comments: No edema or tenderness in extremities  Skin:    General: Skin is warm and dry.  Neurological:     Mental Status: She is alert.     Comments: Alert and oriented x3 Makes eye contact with examiner.   Motor: LUE/LE: 4+-55/5 proximal distal RUE/RLE: 4+-5/5 proximal distal Right upper extremity with mild ataxia Sensation intact  to light touch  Psychiatric:        Mood and Affect: Mood normal.        Behavior: Behavior normal.    Results for orders placed or performed during the hospital encounter of 06/16/20 (from the past 24 hour(s))  CBG monitoring, ED     Status: Abnormal   Collection Time: 06/17/20  1:40 PM  Result Value Ref Range   Glucose-Capillary 286 (H) 70 - 99 mg/dL  CBG monitoring, ED     Status: Abnormal   Collection Time: 06/17/20  5:32 PM  Result Value Ref Range   Glucose-Capillary 301 (H) 70 - 99 mg/dL  TSH     Status: None   Collection Time: 06/17/20  8:39 PM  Result Value Ref Range   TSH 1.485 0.350 - 4.500 uIU/mL  Glucose, capillary     Status: Abnormal   Collection Time: 06/17/20  8:55 PM  Result Value Ref Range   Glucose-Capillary 262 (H) 70 - 99 mg/dL   Comment 1 Notify RN    Comment 2 Document in Chart   Glucose, capillary     Status: Abnormal   Collection Time: 06/18/20  1:58 AM  Result Value Ref Range   Glucose-Capillary 179 (H) 70 - 99 mg/dL   Comment 1 Notify RN    Comment 2 Document in Chart   Basic metabolic panel     Status: Abnormal   Collection Time: 06/18/20  2:54 AM  Result Value Ref Range   Sodium 137 135 - 145 mmol/L   Potassium 3.9 3.5 - 5.1 mmol/L   Chloride 104 98 - 111 mmol/L   CO2 22 22 - 32 mmol/L   Glucose, Bld 215 (H) 70 - 99 mg/dL   BUN 16 6 - 20 mg/dL   Creatinine, Ser 2.11 0.44 - 1.00 mg/dL   Calcium 8.9 8.9 - 94.1 mg/dL   GFR, Estimated >74 >08 mL/min   Anion gap 11 5 - 15  CBC     Status: None   Collection Time: 06/18/20  2:54 AM  Result Value Ref Range   WBC 8.4 4.0 - 10.5 K/uL   RBC 4.32 3.87 - 5.11 MIL/uL   Hemoglobin 13.0 12.0 - 15.0 g/dL   HCT 14.4 36 - 46 %   MCV 90.5 80.0 - 100.0 fL   MCH 30.1 26.0 - 34.0 pg   MCHC 33.2 30.0 - 36.0 g/dL   RDW 81.8 56.3 - 14.9 %   Platelets 202 150 - 400 K/uL   nRBC 0.0 0.0 - 0.2 %  Magnesium     Status: None   Collection Time: 06/18/20  2:54 AM  Result Value Ref Range   Magnesium 1.8 1.7 - 2.4  mg/dL  Glucose, capillary     Status: Abnormal   Collection Time: 06/18/20  6:48 AM  Result Value Ref Range   Glucose-Capillary 329 (H) 70 - 99 mg/dL   Comment 1 Notify RN    Comment 2 Document in Chart   Glucose, capillary     Status: Abnormal  Collection Time: 06/18/20 11:38 AM  Result Value Ref Range   Glucose-Capillary 431 (H) 70 - 99 mg/dL  Glucose, random     Status: Abnormal   Collection Time: 06/18/20 12:11 PM  Result Value Ref Range   Glucose, Bld 424 (H) 70 - 99 mg/dL   CT Head Wo Contrast  Result Date: 06/16/2020 CLINICAL DATA:  59 year old female with numbness, tingling, and paresthesia. EXAM: CT HEAD WITHOUT CONTRAST TECHNIQUE: Contiguous axial images were obtained from the base of the skull through the vertex without intravenous contrast. COMPARISON:  None. FINDINGS: Brain: The ventricles and sulci appropriate size for patient's age. There is a 1.3 x 0.9 cm hypodense area in the left thalamus consistent with an infarct, likely acute or subacute. Clinical correlation and further evaluation with MRI is recommended. 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 There is no acute intracranial hemorrhage. No mass effect or midline shift. No extra-axial fluid collection. Vascular: No hyperdense vessel or unexpected calcification. Skull: Normal. Negative for fracture or focal lesion. Sinuses/Orbits: No acute finding. Other: None IMPRESSION: 1. Left thalamic infarct, likely acute or subacute. 2. No acute intracranial hemorrhage. 3. 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 These results were called by telephone at the time of interpretation on 06/16/2020 at 8:21 pm to provider [facility] , who verbally acknowledged these results. Electronically Signed   By: Elgie Collard M.D.   On: 06/16/2020 20:28   MR ANGIO HEAD WO CONTRAST  Result Date: 06/16/2020 CLINICAL DATA:  Acute neurologic deficit EXAM: MR HEAD WITHOUT CONTRAST MR CIRCLE OF WILLIS WITHOUT CONTRAST MRA OF THE  NECK WITHOUT AND WITH CONTRAST TECHNIQUE: Multiplanar, multiecho pulse sequences of the brain, circle of willis and surrounding structures were obtained without intravenous contrast. Angiographic images of the neck were obtained using MRA technique without and with intravenous contrast. CONTRAST:  5.34mL GADAVIST GADOBUTROL 1 MMOL/ML IV SOLN COMPARISON:  None. FINDINGS: MR HEAD FINDINGS Brain: Small focus of acute ischemia in the left thalamus. Multifocal hyperintense T2-weighted signal within the white matter. There is an old right occipital cortical infarct. Multiple old corona radiata small vessel infarcts. Normal volume of CSF spaces. No chronic microhemorrhage. Normal midline structures. Vascular: Major flow voids are preserved. Skull and upper cervical spine: Normal calvarium and skull base. Visualized upper cervical spine and soft tissues are normal. Sinuses/Orbits:No paranasal sinus fluid levels or advanced mucosal thickening. No mastoid or middle ear effusion. Normal orbits. MR CIRCLE OF WILLIS FINDINGS POSTERIOR CIRCULATION: --Vertebral arteries: Normal --Inferior cerebellar arteries: Normal. --Basilar artery: Normal. --Superior cerebellar arteries: Normal. --Posterior cerebral arteries: Normal. ANTERIOR CIRCULATION: --Intracranial internal carotid arteries: Normal. --Anterior cerebral arteries (ACA): Normal. --Middle cerebral arteries (MCA): Normal. ANATOMIC VARIANTS: Fetal origin of the left PCA. Both P comm's are present. MRA NECK FINDINGS There is an aberrant right subclavian artery. The visualized subclavian arteries are both patent. There is no carotid artery stenosis. The vertebral arteries are left-dominant. Both origins are clearly patent. There is no stenosis or other abnormality. IMPRESSION: 1. Small focus of acute ischemia in the left thalamus. No hemorrhage or mass effect. 2. Old right occipital cortical infarct and multiple old corona radiata small vessel infarcts. 3. Normal MRA of the head  and neck. Electronically Signed   By: Deatra Robinson M.D.   On: 06/16/2020 22:49   MR ANGIO NECK W WO CONTRAST  Result Date: 06/16/2020 CLINICAL DATA:  Acute neurologic deficit EXAM: MR HEAD WITHOUT CONTRAST MR CIRCLE OF WILLIS WITHOUT CONTRAST MRA OF THE NECK WITHOUT AND WITH CONTRAST TECHNIQUE:  Multiplanar, multiecho pulse sequences of the brain, circle of willis and surrounding structures were obtained without intravenous contrast. Angiographic images of the neck were obtained using MRA technique without and with intravenous contrast. CONTRAST:  5.32mL GADAVIST GADOBUTROL 1 MMOL/ML IV SOLN COMPARISON:  None. FINDINGS: MR HEAD FINDINGS Brain: Small focus of acute ischemia in the left thalamus. Multifocal hyperintense T2-weighted signal within the white matter. There is an old right occipital cortical infarct. Multiple old corona radiata small vessel infarcts. Normal volume of CSF spaces. No chronic microhemorrhage. Normal midline structures. Vascular: Major flow voids are preserved. Skull and upper cervical spine: Normal calvarium and skull base. Visualized upper cervical spine and soft tissues are normal. Sinuses/Orbits:No paranasal sinus fluid levels or advanced mucosal thickening. No mastoid or middle ear effusion. Normal orbits. MR CIRCLE OF WILLIS FINDINGS POSTERIOR CIRCULATION: --Vertebral arteries: Normal --Inferior cerebellar arteries: Normal. --Basilar artery: Normal. --Superior cerebellar arteries: Normal. --Posterior cerebral arteries: Normal. ANTERIOR CIRCULATION: --Intracranial internal carotid arteries: Normal. --Anterior cerebral arteries (ACA): Normal. --Middle cerebral arteries (MCA): Normal. ANATOMIC VARIANTS: Fetal origin of the left PCA. Both P comm's are present. MRA NECK FINDINGS There is an aberrant right subclavian artery. The visualized subclavian arteries are both patent. There is no carotid artery stenosis. The vertebral arteries are left-dominant. Both origins are clearly patent.  There is no stenosis or other abnormality. IMPRESSION: 1. Small focus of acute ischemia in the left thalamus. No hemorrhage or mass effect. 2. Old right occipital cortical infarct and multiple old corona radiata small vessel infarcts. 3. Normal MRA of the head and neck. Electronically Signed   By: Deatra Robinson M.D.   On: 06/16/2020 22:49   MR BRAIN WO CONTRAST  Result Date: 06/16/2020 CLINICAL DATA:  Acute neurologic deficit EXAM: MR HEAD WITHOUT CONTRAST MR CIRCLE OF WILLIS WITHOUT CONTRAST MRA OF THE NECK WITHOUT AND WITH CONTRAST TECHNIQUE: Multiplanar, multiecho pulse sequences of the brain, circle of willis and surrounding structures were obtained without intravenous contrast. Angiographic images of the neck were obtained using MRA technique without and with intravenous contrast. CONTRAST:  5.77mL GADAVIST GADOBUTROL 1 MMOL/ML IV SOLN COMPARISON:  None. FINDINGS: MR HEAD FINDINGS Brain: Small focus of acute ischemia in the left thalamus. Multifocal hyperintense T2-weighted signal within the white matter. There is an old right occipital cortical infarct. Multiple old corona radiata small vessel infarcts. Normal volume of CSF spaces. No chronic microhemorrhage. Normal midline structures. Vascular: Major flow voids are preserved. Skull and upper cervical spine: Normal calvarium and skull base. Visualized upper cervical spine and soft tissues are normal. Sinuses/Orbits:No paranasal sinus fluid levels or advanced mucosal thickening. No mastoid or middle ear effusion. Normal orbits. MR CIRCLE OF WILLIS FINDINGS POSTERIOR CIRCULATION: --Vertebral arteries: Normal --Inferior cerebellar arteries: Normal. --Basilar artery: Normal. --Superior cerebellar arteries: Normal. --Posterior cerebral arteries: Normal. ANTERIOR CIRCULATION: --Intracranial internal carotid arteries: Normal. --Anterior cerebral arteries (ACA): Normal. --Middle cerebral arteries (MCA): Normal. ANATOMIC VARIANTS: Fetal origin of the left PCA. Both  P comm's are present. MRA NECK FINDINGS There is an aberrant right subclavian artery. The visualized subclavian arteries are both patent. There is no carotid artery stenosis. The vertebral arteries are left-dominant. Both origins are clearly patent. There is no stenosis or other abnormality. IMPRESSION: 1. Small focus of acute ischemia in the left thalamus. No hemorrhage or mass effect. 2. Old right occipital cortical infarct and multiple old corona radiata small vessel infarcts. 3. Normal MRA of the head and neck. Electronically Signed   By: Deatra Robinson M.D.   On:  06/16/2020 22:49   ECHOCARDIOGRAM COMPLETE BUBBLE STUDY  Result Date: 06/17/2020    ECHOCARDIOGRAM REPORT   Patient Name:   Kim Boyd Date of Exam: 06/17/2020 Medical Rec #:  [id number]       Height:       61.0 in Accession #:    [id number]      Weight:       125.0 lb Date of Birth:  04/15/1961       BSA:          1.547 m Patient Age:    58 years        BP:           140/78 mmHg Patient Gender: F               HR:           74 bpm. Exam Location:  Inpatient Procedure: 2D Echo and Saline Contrast Bubble Study Indications:    stroke 434.91  History:        Patient has no prior history of Echocardiogram examinations.                 Risk Factors:Hypertension and Diabetes.  Sonographer:    Delcie RochLauren Pennington Referring Phys: [id number] Deno LungerGEORGE J SHALHOUB IMPRESSIONS  1. Left ventricular ejection fraction, by estimation, is 55 to 60%. The left ventricle has normal function. The left ventricle has no regional wall motion abnormalities. Left ventricular diastolic parameters are indeterminate.  2. Right ventricular systolic function is normal. The right ventricular size is normal. Tricuspid regurgitation signal is inadequate for assessing PA pressure.  3. The mitral valve is grossly normal. Trivial mitral valve regurgitation. No evidence of mitral stenosis.  4. The aortic valve is grossly normal. Aortic valve regurgitation is not visualized. No aortic  stenosis is present.  5. The inferior vena cava is normal in size with greater than 50% respiratory variability, suggesting right atrial pressure of 3 mmHg.  6. Agitated saline contrast bubble study was negative, with no evidence of any interatrial shunt. Comparison(s): No prior Echocardiogram. Conclusion(s)/Recommendation(s): Normal biventricular function without evidence of hemodynamically significant valvular heart disease. No intracardiac source of embolism detected on this transthoracic study. A transesophageal echocardiogram is recommended to exclude cardiac source of embolism if clinically indicated. FINDINGS  Left Ventricle: Left ventricular ejection fraction, by estimation, is 55 to 60%. The left ventricle has normal function. The left ventricle has no regional wall motion abnormalities. The left ventricular internal cavity size was normal in size. There is  no left ventricular hypertrophy. Left ventricular diastolic parameters are indeterminate. Right Ventricle: The right ventricular size is normal. No increase in right ventricular wall thickness. Right ventricular systolic function is normal. Tricuspid regurgitation signal is inadequate for assessing PA pressure. Left Atrium: Left atrial size was normal in size. Right Atrium: Right atrial size was normal in size. Pericardium: There is no evidence of pericardial effusion. Presence of pericardial fat pad. Mitral Valve: The mitral valve is grossly normal. Trivial mitral valve regurgitation. No evidence of mitral valve stenosis. Tricuspid Valve: The tricuspid valve is grossly normal. Tricuspid valve regurgitation is trivial. No evidence of tricuspid stenosis. Aortic Valve: The aortic valve is grossly normal. Aortic valve regurgitation is not visualized. No aortic stenosis is present. Pulmonic Valve: The pulmonic valve was not well visualized. Pulmonic valve regurgitation is not visualized. Aorta: The aortic root, ascending aorta and aortic arch are all  structurally normal, with no evidence of dilitation or obstruction. Venous: The inferior vena cava is  normal in size with greater than 50% respiratory variability, suggesting right atrial pressure of 3 mmHg. IAS/Shunts: The atrial septum is grossly normal. Agitated saline contrast was given intravenously to evaluate for intracardiac shunting. Agitated saline contrast bubble study was negative, with no evidence of any interatrial shunt.  LEFT VENTRICLE PLAX 2D LVIDd:         4.70 cm  Diastology LVIDs:         3.30 cm  LV e' medial:    5.22 cm/s LV PW:         1.00 cm  LV E/e' medial:  17.8 LV IVS:        1.10 cm  LV e' lateral:   6.42 cm/s LVOT diam:     2.00 cm  LV E/e' lateral: 14.4 LV SV:         58 LV SV Index:   37 LVOT Area:     3.14 cm  RIGHT VENTRICLE             IVC RV S prime:     12.20 cm/s  IVC diam: 1.30 cm TAPSE (M-mode): 1.8 cm LEFT ATRIUM             Index       RIGHT ATRIUM           Index LA diam:        3.60 cm 2.33 cm/m  RA Area:     11.60 cm LA Vol (A2C):   33.7 ml 21.79 ml/m RA Volume:   24.80 ml  16.03 ml/m LA Vol (A4C):   36.9 ml 23.86 ml/m LA Biplane Vol: 35.7 ml 23.08 ml/m  AORTIC VALVE LVOT Vmax:   93.80 cm/s LVOT Vmean:  60.200 cm/s LVOT VTI:    0.184 m  AORTA Ao Root diam: 3.10 cm Ao Asc diam:  3.00 cm MITRAL VALVE MV Area (PHT): 3.48 cm     SHUNTS MV Decel Time: 218 msec     Systemic VTI:  0.18 m MV E velocity: 92.70 cm/s   Systemic Diam: 2.00 cm MV A velocity: 110.00 cm/s MV E/A ratio:  0.84 Jodelle Red MD Electronically signed by Jodelle Red MD Signature Date/Time: 06/17/2020/1:34:44 PM    Final     Assessment/Plan: Diagnosis: left thalamic infarction  Stroke: Continue secondary stroke prophylaxis and Risk Factor Modification listed below:   Antiplatelet therapy:   Blood Pressure Management:  Continue current medication with prn's with permisive HTN per primary team Statin Agent:   Diabetes management:   Tobacco abuse:   PT/OT for mobility, ADL  training  Motor recovery: Zoloft Labs independently reviewed.  Records reviewed and summated above.  1. Does the need for close, 24 hr/day medical supervision in concert with the patient's rehab needs make it unreasonable for this patient to be served in a less intensive setting? Potentially  2. Co-Morbidities requiring supervision/potential complications: DM, extremely labile with significant hyperglycemia and hypoglycemia (Monitor in accordance with exercise and adjust meds as necessary), HTN (monitor and provide prns in accordance with increased physical exertion and pain), polysubstance abuse (counsel), CVA April 2017  3. Due to safety, disease management and patient education, does the patient require 24 hr/day rehab nursing? Potentially 4. Does the patient require coordinated care of a physician, rehab nurse, therapy disciplines of PT/OT to address physical and functional deficits in the context of the above medical diagnosis(es)? Potentially Addressing deficits in the following areas: balance, endurance, locomotion, transferring, bathing, dressing, toileting and psychosocial support 5. Can  the patient actively participate in an intensive therapy program of at least 3 hrs of therapy per day at least 5 days per week? Yes 6. The potential for patient to make measurable gains while on inpatient rehab is excellent 7. Anticipated functional outcomes upon discharge from inpatient rehab are modified independent and supervision  with PT, modified independent and supervision with OT, n/a with SLP. 8. Estimated rehab length of stay to reach the above functional goals is: 8-13 days. 9. Anticipated discharge destination: Home 10. Overall Rehab/Functional Prognosis: good  RECOMMENDATIONS: This patient's condition is appropriate for continued rehabilitative care in the following setting: Patient progressing well in therapies.  Recommend CIR if patient does not progress to supervision level of  functioning. Patient has agreed to participate in recommended program. Yes Note that insurance prior authorization may be required for reimbursement for recommended care.  Comment: Rehab Admissions Coordinator to follow up.  I have personally performed a face to face diagnostic evaluation, including, but not limited to relevant history and physical exam findings, of this patient and developed relevant assessment and plan.  Additionally, I have reviewed and concur with the physician assistant's documentation above.   Maryla Morrow, MD, ABPMR Mcarthur Rossetti Angiulli, PA-C 06/18/2020

## 2020-06-18 NOTE — Plan of Care (Signed)
  Problem: Education: Goal: Knowledge of secondary prevention will improve Outcome: Progressing   Problem: Education: Goal: Knowledge of secondary prevention will improve Outcome: Progressing   Problem: Education: Goal: Knowledge of disease or condition will improve Outcome: Progressing   Problem: Skin Integrity: Goal: Risk for impaired skin integrity will decrease Outcome: Progressing   Problem: Safety: Goal: Ability to remain free from injury will improve Outcome: Progressing

## 2020-06-18 NOTE — Plan of Care (Signed)

## 2020-06-18 NOTE — Progress Notes (Signed)
Admitted from [facility]  A 59 yrs old [place] female  wha came in with rt sided weakness numbness and tingling  With LKW 11/28  Pt positve for r Left Thalamic stroke' Pt alert oriented x  ,                                                                                                                                                        Mae Pupil PERRL4 with  Unsteady gait. Oriented to the use of the call light to call for assist when  Needed. Plan  ,Cardiac monitor  In use NSR and verification with CCMT.; plan of care reviewed  Stroke education on smoking cessation , glucose control and  S/s of stroke  Discussed with pt and pt demonstrated good understanding. Vs stable. RN will continue to monitor.

## 2020-06-18 NOTE — TOC CAGE-AID Note (Signed)
Transition of Care [facility]) - CAGE-AID Screening   Patient Details  Name: Kim Boyd MRN: [id number] Date of Birth: 1960-10-23  [facility]) CM/SW Contact:    Emeterio Reeve, [place] Phone Number: 06/18/2020, 4:21 PM   Clinical Narrative:  CSW met with pt at bedside. CSW introduced self and explained role at the hospital.  Pt reports occasional alcohol use. Pt reports occasional substance use. Pt did not elaborate. Pt did not want any resources at this time.    CAGE-AID Screening:    Have You Ever Felt You Ought to Cut Down on Your Drinking or Drug Use?: No Have People Annoyed You By Critizing Your Drinking Or Drug Use?: No Have You Felt Bad Or Guilty About Your Drinking Or Drug Use?: No Have You Ever Had a Drink or Used Drugs First Thing In The Morning to Steady Your Nerves or to Get Rid of a Hangover?: No CAGE-AID Score: 0  Substance Abuse Education Offered: Yes  Substance abuse interventions: Patient Counseling   Emeterio Reeve, Latanya Presser, [place] Social Worker [phone]

## 2020-06-18 NOTE — Evaluation (Signed)
Speech Language Pathology Evaluation Patient Details Name: Almarosa Bohac MRN: [id number] DOB: 10-Jun-1961 Today's Date: 06/18/2020 Time: 0810-0826 SLP Time Calculation (min) (ACUTE ONLY): 16 min  Problem List:  Patient Active Problem List   Diagnosis Date Noted  . Arterial ischemic stroke, vertebrobasilar, thalamic, acute, left (HCC) 06/17/2020  . Type 2 diabetes mellitus with diabetic polyneuropathy, with long-term current use of insulin (HCC) 06/17/2020  . Essential hypertension 06/17/2020  . Diabetic polyneuropathy associated with type 2 diabetes mellitus (HCC) 06/17/2020  . Nicotine dependence, cigarettes, uncomplicated 06/17/2020  . Acute CVA (cerebrovascular accident) (HCC) 06/17/2020   Past Medical History:  Past Medical History:  Diagnosis Date  . Diabetes mellitus without complication (HCC)   . Hypertension   . Stroke [facility])    April 2017   Past Surgical History:  Past Surgical History:  Procedure Laterality Date  . CESAREAN SECTION     HPI:  Pt is a 59 y/o female admitted secondary to R sided weakness and numbness. 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 PMH includes HTN, HTN, and CVA   Assessment / Plan / Recommendation Clinical Impression  Pts cognitive linguistic abilites appear within functional limits; no change from baseline. Pt oriented x4, denies acute changes with thinking skills or speech and language abilities. She states some mild anomia of speech following first stroke that has not changed since acute CVA. No anomia exhibited during interaction with SLP. PLOF pt lives in handicap apartment alone, on disability, has 4 daughters that can assist intermittently post DC. Pt independent with medicine and financial managemet, coordinating with family for planning to assist with payments while hospitalized. Motor speech skills appeared intact, right facial, buccal reduced sensation and tingling persists. No ST needs identified.     SLP Assessment  SLP  Recommendation/Assessment: Patient does not need any further Speech Lanaguage Pathology Services SLP Visit Diagnosis: Cognitive communication deficit (R41.841)    Follow Up Recommendations  None    Frequency and Duration  N/A         SLP Evaluation Cognition  Overall Cognitive Status: Within Functional Limits for tasks assessed Arousal/Alertness: Awake/alert Orientation Level: Oriented X4 Memory: Appears intact Awareness: Appears intact Problem Solving: Appears intact Executive Function: Landscape architect: Appears intact Safety/Judgment: Appears intact       Comprehension  Auditory Comprehension Overall Auditory Comprehension: Appears within functional limits for tasks assessed Reading Comprehension Reading Status: Within funtional limits    Expression Expression Primary Mode of Expression: Verbal Verbal Expression Overall Verbal Expression: Impaired at baseline (reports some mild anomia of speech, states no change ) Written Expression Dominant Hand: Left   Oral / Motor  Oral Motor/Sensory Function Overall Oral Motor/Sensory Function: Mild impairment Facial ROM: Within Functional Limits Facial Symmetry: Within Functional Limits Facial Strength: Within Functional Limits Facial Sensation: Reduced right Lingual ROM: Within Functional Limits Lingual Symmetry: Within Functional Limits Lingual Strength: Within Functional Limits Lingual Sensation: Within Functional Limits Velum: Within Functional Limits Mandible: Within Functional Limits Motor Speech Overall Motor Speech: Appears within functional limits for tasks assessed   GO                    Jayah Balthazar E Mitsuru Dault MA, CCC-SLP Acute Rehabilitation Services 06/18/2020, 8:41 AM

## 2020-06-18 NOTE — Evaluation (Signed)
Occupational Therapy Evaluation Patient Details Name: Kim Boyd MRN: [id number] DOB: 02-20-1961 Today's Date: 06/18/2020    History of Present Illness Pt is a 59 y/o female admitted secondary to R sided weakness and numbness. 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 PMH includes HTN, HTN, and CVA.    Clinical Impression   PT admitted with L thalamus infarct. Pt currently with functional limitiations due to the deficits listed below (see OT problem list). Pt currently with R hand deficits. Pt is L handed but completes many task with R hand such as peeling potatoes per patient. Pt wants to maximize independence to dc home alone/  Pt will benefit from skilled OT to increase their independence and safety with adls and balance to allow discharge CIR.     Follow Up Recommendations  CIR    Equipment Recommendations  Other (comment) (RW)    Recommendations for Other Services Rehab consult     Precautions / Restrictions Precautions Precautions: Fall Restrictions Weight Bearing Restrictions: No      Mobility Bed Mobility Overal bed mobility: Modified Independent Bed Mobility: Supine to Sit;Sit to Supine     Supine to sit: Supervision;HOB elevated Sit to supine: Supervision;HOB elevated        Transfers Overall transfer level: Needs assistance Equipment used: Rolling walker (2 wheeled) Transfers: Sit to/from Stand Sit to Stand: Supervision              Balance Overall balance assessment: Needs assistance Sitting-balance support: No upper extremity supported;Feet supported Sitting balance-Leahy Scale: Good Sitting balance - Comments: supervision to don socks sitting up in bed   Standing balance support: Single extremity supported;No upper extremity supported Standing balance-Leahy Scale: Poor Standing balance comment: reliant on UE support of RW                           ADL either performed or assessed with clinical judgement   ADL Overall  ADL's : Needs assistance/impaired Eating/Feeding: Set up;Bed level Eating/Feeding Details (indicate cue type and reason): nearly spilling cup attempting to place straw. pt states i poured syrup all over myself this morning Grooming: Wash/dry hands;Supervision/safety                   Toilet Transfer: Supervision/safety;RW;Regular Toilet;Grab bars   Toileting- Architect and Hygiene: Supervision/safety               Vision Baseline Vision/History: Wears glasses Wears Glasses: At all times       Perception     Praxis      Pertinent Vitals/Pain Pain Assessment: No/denies pain     Hand Dominance Left   Extremity/Trunk Assessment Upper Extremity Assessment Upper Extremity Assessment: RUE deficits/detail RUE Deficits / Details: numb tingling prickly , demetric movement   Lower Extremity Assessment Lower Extremity Assessment: Defer to PT evaluation   Cervical / Trunk Assessment Cervical / Trunk Assessment: Normal   Communication Communication Communication: No difficulties   Cognition Arousal/Alertness: Awake/alert Behavior During Therapy: WFL for tasks assessed/performed Overall Cognitive Status: Within Functional Limits for tasks assessed                                     General Comments  VSS on RA, pt fatigues quickly    Exercises     Shoulder Instructions      Home Living Family/patient expects to be discharged to::  Private residence Living Arrangements: Alone Available Help at Discharge: Family;Available PRN/intermittently Type of Home: Apartment Home Access: Level entry     Home Layout: One level     Bathroom Shower/Tub: Chief Strategy Officer: Standard     Home Equipment: Environmental consultant - 2 wheels;Cane - single point;Tub bench;Bedside commode;Grab bars - toilet;Grab bars - tub/shower (5 bars in the bathroom)   Additional Comments: daughter lives nearby/ uses transportation through insurance and requires  more than week ahead of time  Lives With: Alone    Prior Functioning/Environment Level of Independence: Independent                 OT Problem List: Decreased strength;Decreased activity tolerance;Impaired balance (sitting and/or standing);Decreased safety awareness;Decreased knowledge of use of DME or AE;Decreased knowledge of precautions      OT Treatment/Interventions: Self-care/ADL training;Therapeutic exercise;Neuromuscular education;Energy conservation;DME and/or AE instruction;Manual therapy;Therapeutic activities;Patient/family education;Balance training    OT Goals(Current goals can be found in the care plan section) Acute Rehab OT Goals Patient Stated Goal: to not feel this tired OT Goal Formulation: With patient Time For Goal Achievement: 07/02/20 Potential to Achieve Goals: Good  OT Frequency: Min 2X/week   Barriers to D/C: Decreased caregiver support  can call daughter or friends if needed. pt states she prefers to be a private person       Co-evaluation              AM-PAC OT "6 Clicks" Daily Activity     Outcome Measure Help from another person eating meals?: A Little Help from another person taking care of personal grooming?: A Little Help from another person toileting, which includes using toliet, bedpan, or urinal?: A Little Help from another person bathing (including washing, rinsing, drying)?: A Little Help from another person to put on and taking off regular upper body clothing?: A Little Help from another person to put on and taking off regular lower body clothing?: A Little 6 Click Score: 18   End of Session Equipment Utilized During Treatment: Rolling walker Nurse Communication: Mobility status;Precautions  Activity Tolerance: Patient tolerated treatment well Patient left: in bed;with call bell/phone within reach;with bed alarm set  OT Visit Diagnosis: Unsteadiness on feet (R26.81);Muscle weakness (generalized) (M62.81)                 Time: 1051-1110 OT Time Calculation (min): 19 min Charges:  OT General Charges $OT Visit: 1 Visit OT Evaluation $OT Eval Low Complexity: 1 Low   Brynn, OTR/L  Acute Rehabilitation Services Pager: [phone] Office: [phone] .   Mateo Flow 06/18/2020, 11:12 AM

## 2020-06-19 LAB — BASIC METABOLIC PANEL
Anion gap: 9 (ref 5–15)
BUN: 12 mg/dL (ref 6–20)
CO2: 23 mmol/L (ref 22–32)
Calcium: 8.9 mg/dL (ref 8.9–10.3)
Chloride: 104 mmol/L (ref 98–111)
Creatinine, Ser: 0.81 mg/dL (ref 0.44–1.00)
GFR, Estimated: >60 mL/min (ref >60)
Glucose, Bld: 235 mg/dL — ABNORMAL HIGH (ref 70–99)
Potassium: 3.9 mmol/L (ref 3.5–5.1)
Sodium: 136 mmol/L (ref 135–145)

## 2020-06-19 LAB — CBC
HCT: 39.4 % (ref 36.0–46.0)
Hemoglobin: 13.3 g/dL (ref 12.0–15.0)
MCH: 30.7 pg (ref 26.0–34.0)
MCHC: 33.8 g/dL (ref 30.0–36.0)
MCV: 91 fL (ref 80.0–100.0)
Platelets: 199 10*3/uL (ref 150–400)
RBC: 4.33 MIL/uL (ref 3.87–5.11)
RDW: 13.9 % (ref 11.5–15.5)
WBC: 7.8 10*3/uL (ref 4.0–10.5)
nRBC: 0 % (ref 0.0–0.2)

## 2020-06-19 LAB — GLUCOSE, CAPILLARY
Glucose-Capillary: 252 mg/dL — ABNORMAL HIGH (ref 70–99)
Glucose-Capillary: 242 mg/dL — ABNORMAL HIGH (ref 70–99)
Glucose-Capillary: 292 mg/dL — ABNORMAL HIGH (ref 70–99)

## 2020-06-19 LAB — MAGNESIUM: Magnesium: 1.8 mg/dL (ref 1.7–2.4)

## 2020-06-19 MED ORDER — CLOPIDOGREL BISULFATE 75 MG PO TABS: 75.0000 mg | ORAL_TABLET | Freq: Every day | ORAL | 3 refills | Status: AC

## 2020-06-19 MED ORDER — ASPIRIN 81 MG PO TBEC: 81.0000 mg | DELAYED_RELEASE_TABLET | Freq: Every day | ORAL | Status: AC

## 2020-06-19 MED ORDER — ROSUVASTATIN CALCIUM 20 MG PO TABS: 20.0000 mg | ORAL_TABLET | Freq: Every day | ORAL | 3 refills | Status: AC

## 2020-06-19 NOTE — Progress Notes (Signed)
Inpatient Rehab Admissions Coordinator:   Note updated therapy recommendations to home health.  Spoke to pt over the phone and she is in agreement.  Daughters plan to help out at home at discharge.  Will sign off for CIR at this time.   Estill Dooms, PT, DPT Admissions Coordinator [phone] 06/19/20  12:03 PM

## 2020-06-19 NOTE — Discharge Instructions (Signed)
Plavix is a new medication that you will stay on long term. Please continue this daily.  Take aspirin 81 mg daily for 21 days then stop, as per neurology recommendations.  Follow up with neurology.

## 2020-06-19 NOTE — Progress Notes (Signed)
Inpatient Diabetes Program Recommendations  AACE/ADA: New Consensus Statement on Inpatient Glycemic Control (2015)  Target Ranges:  Prepandial:   less than 140 mg/dL      Peak postprandial:   less than 180 mg/dL (1-2 hours)      Critically ill patients:  140 - 180 mg/dL   Lab Results  Component Value Date   GLUCAP 242 (H) 06/19/2020   HGBA1C 9.5 (H) 06/16/2020    Review of Glycemic Control Results for MYLIN, GIGNAC (MRN [id number]) as of 06/19/2020 08:37  Ref. Range 06/18/2020 11:38 06/18/2020 14:21 06/18/2020 15:39 06/18/2020 17:12 06/18/2020 18:30 06/18/2020 22:05 06/19/2020 05:03 06/19/2020 06:47  Glucose-Capillary Latest Ref Range: 70 - 99 mg/dL 630 (H) 160 (H) 109 (H) 72 108 (H) 228 (H) 252 (H) 242 (H)   Diabetes history:  DM2 Outpatient Diabetes medications:  Lantus 40 units daily Glipizide 5 mg bid Current orders for Inpatient glycemic control:  Lantus 25 units daily  Novolog 0-20 units tid & hs  Inpatient Diabetes Program Recommendations:     Lantus 35 units daily  Will continue to follow while inpatient.  Thank you, Dulce Sellar, RN, BSN Diabetes Coordinator Inpatient Diabetes Program [phone] (team pager from 8a-5p)

## 2020-06-19 NOTE — Progress Notes (Signed)
Occupational Therapy Treatment Patient Details Name: Kim Boyd MRN: [id number] DOB: [birth year] Today's Date: 06/19/2020    History of present illness Pt is a 59 y/o female admitted secondary to R sided weakness and numbness. 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 PMH includes HTN, HTN, and CVA.    OT comments  Pt progressing towards established OT goals. Pt performing grooming at sink with supervision. Pt also performing UB and LB bathing in shower with supervision and BSC for shower seat. Pt continues with present with decreased activity tolerance and balance. Update dc recommendation to dc home with HHOT and will continue to follow acutely as admitted.    Follow Up Recommendations  Home health OT;Supervision/Assistance - 24 hour    Equipment Recommendations  Other (comment) (rollator)    Recommendations for Other Services Rehab consult    Precautions / Restrictions Precautions Precautions: Fall Restrictions Weight Bearing Restrictions: No       Mobility Bed Mobility Overal bed mobility: Independent             General bed mobility comments: OOB with PT upon arrival  Transfers Overall transfer level: Needs assistance Equipment used: Rolling walker (2 wheeled);4-wheeled walker Transfers: Sit to/from Stand Sit to Stand: Supervision Stand pivot transfers: Supervision       General transfer comment: Supervision for safety    Balance Overall balance assessment: Needs assistance Sitting-balance support: No upper extremity supported;Feet supported Sitting balance-Leahy Scale: Good Sitting balance - Comments: supervision to don socks sitting up in bed   Standing balance support: Single extremity supported Standing balance-Leahy Scale: Poor Standing balance comment: reliant on UE support of DME                           ADL either performed or assessed with clinical judgement   ADL Overall ADL's : Needs assistance/impaired     Grooming:  Wash/dry face;Supervision/safety;Standing   Upper Body Bathing: Supervision/ safety;Sitting   Lower Body Bathing: Supervison/ safety;Sit to/from stand           Toilet Transfer: Supervision/safety;BSC;Ambulation           Functional mobility during ADLs: Supervision/safety;Rolling walker General ADL Comments: Pt performing grooming, BSC trasnfer, and bathing at shower with supervision.      Vision       Perception     Praxis      Cognition Arousal/Alertness: Awake/alert Behavior During Therapy: WFL for tasks assessed/performed Overall Cognitive Status: Impaired/Different from baseline Area of Impairment: Safety/judgement;Awareness                         Safety/Judgement: Decreased awareness of safety Awareness: Emergent   General Comments: Pt with decreased asfety awareness, but feel this is close to baseline function        Exercises     Shoulder Instructions       General Comments VSS on RA    Pertinent Vitals/ Pain       Pain Assessment: No/denies pain  Home Living                                          Prior Functioning/Environment              Frequency  Min 2X/week        Progress Toward Goals  OT Goals(current goals can  now be found in the care plan section)  Progress towards OT goals: Progressing toward goals  Acute Rehab OT Goals Patient Stated Goal: to go home OT Goal Formulation: With patient Time For Goal Achievement: 07/02/20 Potential to Achieve Goals: Good ADL Goals Pt Will Perform Lower Body Dressing: with modified independence;sit to/from stand Pt Will Perform Tub/Shower Transfer: Tub transfer;with modified independence;ambulating;grab bars;rolling walker Pt/caregiver will Perform Home Exercise Program: Left upper extremity;Independently;With written HEP provided Additional ADL Goal #1: pt will complete basic transfer with RW mod I  Plan Discharge plan remains appropriate     Co-evaluation                 AM-PAC OT "6 Clicks" Daily Activity     Outcome Measure   Help from another person eating meals?: A Little Help from another person taking care of personal grooming?: A Little Help from another person toileting, which includes using toliet, bedpan, or urinal?: A Little Help from another person bathing (including washing, rinsing, drying)?: A Little Help from another person to put on and taking off regular upper body clothing?: A Little Help from another person to put on and taking off regular lower body clothing?: A Little 6 Click Score: 18    End of Session Equipment Utilized During Treatment: Rolling walker  OT Visit Diagnosis: Unsteadiness on feet (R26.81);Muscle weakness (generalized) (M62.81)   Activity Tolerance Patient tolerated treatment well   Patient Left with call bell/phone within reach;in chair;with chair alarm set   Nurse Communication Mobility status;Precautions        Time: 2330-0762 OT Time Calculation (min): 27 min  Charges: OT General Charges $OT Visit: 1 Visit OT Treatments $Self Care/Home Management : 23-37 mins  Euan Wandler MSOT, OTR/L Acute Rehab Pager: [phone] Office: [phone]   Theodoro Grist Juleon Narang 06/19/2020, 1:45 PM

## 2020-06-19 NOTE — Progress Notes (Signed)
Physical Therapy Treatment Patient Details Name: Kim Boyd MRN: [id number] DOB: [birth year] Today's Date: 06/19/2020    History of Present Illness Pt is a 59 y/o female admitted secondary to R sided weakness and numbness. 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 PMH includes HTN, HTN, and CVA.     PT Comments    Pt tolerates treatment well, ambulating for increased distances and with improved balance. Pt notes improved sensation in RLE with less tingling, and demonstrates intact proprioception in RLE. PT provides education on use of 4 wheeled walker and pt demonstrates good control and use of hand brakes during session. PT updating discharge recommendations to home with Home health PT, a 4 wheeled walker, and intermittent assistance from family.    Follow Up Recommendations  Home health PT;Supervision - Intermittent     Equipment Recommendations  Other (comment) (4 wheeled walker with seat)    Recommendations for Other Services       Precautions / Restrictions Precautions Precautions: Fall Restrictions Weight Bearing Restrictions: No    Mobility  Bed Mobility Overal bed mobility: Independent                Transfers Overall transfer level: Needs assistance Equipment used: Rolling walker (2 wheeled);4-wheeled walker Transfers: Sit to/from [employer] Sit to Stand: Supervision Stand pivot transfers: Supervision          Ambulation/Gait Ambulation/Gait assistance: Supervision Gait Distance (Feet): 200 Feet (additional trial of 28' with 4 wheeled walker) Assistive device: Rolling walker (2 wheeled);4-wheeled walker Gait Pattern/deviations: Step-through pattern Gait velocity: reduced Gait velocity interpretation: 1.31 - 2.62 ft/sec, indicative of limited community ambulator General Gait Details: pt with shortened and slowed step-through gait pattern, no significant losses of balance or knee buckling noted. Pt with slight increase in trunk  flexion when utilizing 4 wheeled walker   Stairs             Wheelchair Mobility    Modified Rankin (Stroke Patients Only) Modified Rankin (Stroke Patients Only) Pre-Morbid Rankin Score: No symptoms Modified Rankin: Moderately severe disability     Balance Overall balance assessment: Needs assistance Sitting-balance support: No upper extremity supported;Feet supported Sitting balance-Leahy Scale: Good     Standing balance support: Single extremity supported Standing balance-Leahy Scale: Poor Standing balance comment: reliant on UE support of DME                            Cognition Arousal/Alertness: Awake/alert Behavior During Therapy: WFL for tasks assessed/performed Overall Cognitive Status: Within Functional Limits for tasks assessed                                        Exercises      General Comments General comments (skin integrity, edema, etc.): VSS on RA, pt with improving sensation in RLE, reporting some reduction in tingling in RLE. Pt with intact proprioception in RLE      Pertinent Vitals/Pain Pain Assessment: No/denies pain    Home Living                      Prior Function            PT Goals (current goals can now be found in the care plan section) Acute Rehab PT Goals Patient Stated Goal: to go home Progress towards PT goals: Progressing toward goals  Frequency    Min 4X/week      PT Plan Current plan remains appropriate    Co-evaluation              AM-PAC PT "6 Clicks" Mobility   Outcome Measure  Help needed turning from your back to your side while in a flat bed without using bedrails?: None Help needed moving from lying on your back to sitting on the side of a flat bed without using bedrails?: None Help needed moving to and from a bed to a chair (including a wheelchair)?: None Help needed standing up from a chair using your arms (e.g., wheelchair or bedside chair)?:  None Help needed to walk in hospital room?: None Help needed climbing 3-5 steps with a railing? : A Little 6 Click Score: 23    End of Session   Activity Tolerance: Patient tolerated treatment well Patient left: in chair;with call bell/phone within reach;Other (comment) (OT present in room) Nurse Communication: Mobility status PT Visit Diagnosis: Unsteadiness on feet (R26.81);Muscle weakness (generalized) (M62.81);Difficulty in walking, not elsewhere classified (R26.2)     Time: 8299-3716 PT Time Calculation (min) (ACUTE ONLY): 16 min  Charges:  $Gait Training: 8-22 mins                     Arlyss Gandy, PT, DPT Acute Rehabilitation Pager: [phone]    Arlyss Gandy 06/19/2020, 11:41 AM

## 2020-06-19 NOTE — TOC Initial Note (Addendum)
Transition of Care [facility]) - Initial/Assessment Note    Patient Details  Name: Kim Boyd MRN: [id number] Date of Birth: Oct 27, 1960  Transition of [facility]) CM/SW Contact:    Kermit Balo, RN Phone Number: 06/19/2020, 8:07 AM  Clinical Narrative:                 Pt is from home alone. She has 2 daughters that live close.  Awaiting CIR work up.  Medications are delivered to her home per [facility]. [employer] provides her transportation for her medical needs.  TOC following.  Expected Discharge Plan: IP Rehab Facility Barriers to Discharge: Continued Medical Work up   Patient Goals and CMS Choice     Choice offered to / list presented to : Patient  Expected Discharge Plan and Services Expected Discharge Plan: IP Rehab Facility   Discharge Planning Services: CM Consult Post Acute Care Choice: IP Rehab Living arrangements for the past 2 months: Single Family Home                                      Prior Living Arrangements/Services Living arrangements for the past 2 months: Single Family Home Lives with:: Self Patient language and need for interpreter reviewed:: Yes Do you feel safe going back to the place where you live?: Yes      Need for Family Participation in Patient Care: Yes (Comment) Care giver support system in place?: No (comment) Current home services: DME (cane/ 3 in 1/ shower seat) Criminal Activity/Legal Involvement Pertinent to Current Situation/Hospitalization: No - Comment as needed  Activities of Daily Living Home Assistive Devices/Equipment: None ADL Screening (condition at time of admission) Patient's cognitive ability adequate to safely complete daily activities?: Yes Is the patient deaf or have difficulty hearing?: No Does the patient have difficulty seeing, even when wearing glasses/contacts?: Yes Does the patient have difficulty concentrating, remembering, or making decisions?: Yes Patient able to express need for assistance  with ADLs?: Yes Does the patient have difficulty dressing or bathing?: Yes Independently performs ADLs?: Yes (appropriate for developmental age) Does the patient have difficulty walking or climbing stairs?: Yes Weakness of Legs: Both Weakness of Arms/Hands: Left  Permission Sought/Granted                  Emotional Assessment Appearance:: Appears stated age Attitude/Demeanor/Rapport: Engaged Affect (typically observed): Accepting Orientation: : Oriented to Self, Oriented to Place, Oriented to  Time, Oriented to Situation Alcohol / Substance Use: Illicit Drugs Psych Involvement: No (comment)  Admission diagnosis:  Hyperglycemia [R73.9] Thalamic infarct, acute (HCC) [I63.9] Acute CVA (cerebrovascular accident) (HCC) [I63.9] Arterial ischemic stroke, vertebrobasilar, thalamic, acute, left (HCC) [W43.154, I63.22] Patient Active Problem List   Diagnosis Date Noted  . Labile blood glucose   . Polysubstance abuse (HCC)   . Controlled type 2 diabetes mellitus with hyperglycemia (HCC)   . History of CVA with residual deficit   . Arterial ischemic stroke, vertebrobasilar, thalamic, acute, left (HCC) 06/17/2020  . Type 2 diabetes mellitus with diabetic polyneuropathy, with long-term current use of insulin (HCC) 06/17/2020  . Essential hypertension 06/17/2020  . Diabetic polyneuropathy associated with type 2 diabetes mellitus (HCC) 06/17/2020  . Nicotine dependence, cigarettes, uncomplicated 06/17/2020  . Acute CVA (cerebrovascular accident) (HCC) 06/17/2020   PCP:  Renaldo Harrison, DO Pharmacy:   [facility] [address] [place] - [address] [place]  [address] Phone: [phone] Fax: [phone]     Social Determinants of Health (SDOH) Interventions    Readmission Risk Interventions No flowsheet data found.

## 2020-06-19 NOTE — TOC Transition Note (Signed)
Transition of Care [facility]) - CM/SW Discharge Note   Patient Details  Name: Shena Vinluan MRN: [id number] Date of Birth: 02/10/1961  Transition of Care [facility]) CM/SW Contact:  Kermit Balo, RN Phone Number: 06/19/2020, 3:25 PM   Clinical Narrative:    Pt discharging home with [facility] services through [facility]. Cheryl with [facility] accepted the referral.  Rollator to be delivered to the room per [facility]. Pt has transportation home.    Final next level of care: Home w Home Health Services Barriers to Discharge: No Barriers Identified   Patient Goals and CMS Choice   CMS Medicare.gov Compare Post Acute Care list provided to:: Patient Choice offered to / list presented to : Patient  Discharge Placement                       Discharge Plan and Services   Discharge Planning Services: CM Consult Post Acute Care Choice: IP Rehab          DME Arranged: Walker rolling with seat DME Agency: [employer] Date DME Agency Contacted: 06/19/20   Representative spoke with at DME Agency: Velna Hatchet HH Arranged: PT, OT [employer] [facility] Agency: [employer] [facility] Date [facility] Agency Contacted: 06/19/20   Representative spoke with at [facility] Agency: Elnita Maxwell  Social Determinants of Health (SDOH) Interventions     Readmission Risk Interventions No flowsheet data found.

## 2020-06-19 NOTE — Discharge Summary (Signed)
Physician Discharge Summary   Kim Boyd BHA:[id number] DOB: September 08, 1960 DOA: 06/16/2020  PCP: Renaldo Harrison, DO  Admit date: 06/16/2020 Discharge date: 06/19/2020  Admitted From: Home Disposition: Home Discharging physician: Lewie Chamber, MD  Recommendations for Outpatient Follow-up:  1. Follow-up with neurology  Patient discharged to home in Discharge Condition: stable CODE STATUS: Full Diet recommendation:  Diet Orders (From admission, onward)    Start     Ordered   06/19/20 0000  Diet - low sodium heart healthy        06/19/20 1409   06/19/20 0000  Diet Carb Modified        06/19/20 1409   06/17/20 1413  Diet Carb Modified Fluid consistency: Thin; Room service appropriate? Yes  Diet effective now       Question Answer Comment  Diet-HS Snack? Nothing   Calorie Level Medium 1600-2000   Fluid consistency: Thin   Room service appropriate? Yes      06/17/20 1412          Hospital Course: 59 year old with history of chronic low back pain, DM2, HTN, peripheral neuropathy, previous CVA 2017 came to [facility] with complaints of right-sided numbness and tingling all day.  She had some associated weakness and a headache prior to admission as well. CT head showed possible left subacute/acute thalamic infarct, neurology team was consulted.  Started on aspirin and Plavix. MRI brain showed an acute left-sided thalamic infarct and old right-sided hemispheric infarct.  MRA of the head and neck was unremarkable. Of note, UDS was also positive for [facility] and cocaine on admission.  Patient did deny personal cocaine use. She was evaluated by neurology on admission.  She was recommended to continue on aspirin and Plavix for 3 weeks then Plavix alone.  Acute CVA -Left thalamic infarct and old right cerebellar infarcts -MRA head and neck unremarkable -Echo reviewed and also noncontributory -Continue statin -Per neurology, aspirin and Plavix for 3 weeks then Plavix alone -Patient  progressed well with PT and was recommended for discharging home with home health  HTN - continue lisinopril and hctz  HLD - continue crestor  DMII - A1c 9.5% - continue Lantus and SSI - CBGs over goal today; will adjust coverage as necessary  Tobacco abuse - ongoing cessation recommended  Substance use - UDS positive for cocaine; patient denies personal use (was around someone who smoked it?)   The patient's chronic medical conditions were treated accordingly per the patient's home medication regimen except as noted.  On day of discharge, patient was felt deemed stable for discharge. Patient/family member advised to call PCP or come back to ER if needed.   Principal Diagnosis: Arterial ischemic stroke, vertebrobasilar, thalamic, acute, left [facility])  Discharge Diagnoses: Active Hospital Problems   Diagnosis Date Noted  . Arterial ischemic stroke, vertebrobasilar, thalamic, acute, left (HCC) 06/17/2020  . Labile blood glucose   . Polysubstance abuse (HCC)   . Controlled type 2 diabetes mellitus with hyperglycemia (HCC)   . History of CVA with residual deficit   . Type 2 diabetes mellitus with diabetic polyneuropathy, with long-term current use of insulin (HCC) 06/17/2020  . Essential hypertension 06/17/2020  . Diabetic polyneuropathy associated with type 2 diabetes mellitus (HCC) 06/17/2020  . Nicotine dependence, cigarettes, uncomplicated 06/17/2020  . Acute CVA (cerebrovascular accident) [facility]) 06/17/2020    Resolved Hospital Problems  No resolved problems to display.    Discharge Instructions    Diet - low sodium heart healthy   Complete by: As directed  Diet Carb Modified   Complete by: As directed    Increase activity slowly   Complete by: As directed      Allergies as of 06/19/2020      Reactions   Other Anaphylaxis, Rash, Hives   Allergy to enzyme injected in animals      Medication List    STOP taking these medications   Janumet 50-1000 MG  tablet Generic drug: sitaGLIPtin-metformin     TAKE these medications   amitriptyline 25 MG tablet Commonly known as: ELAVIL Take 1 tablet by mouth daily.   aspirin 81 MG EC tablet Take 1 tablet (81 mg total) by mouth daily for 21 days.   clopidogrel 75 MG tablet Commonly known as: PLAVIX Take 1 tablet (75 mg total) by mouth daily. Start taking on: June 20, 2020   gabapentin 300 MG capsule Commonly known as: NEURONTIN Take 300 mg by mouth 3 (three) times daily. What changed: Another medication with the same name was removed. Continue taking this medication, and follow the directions you see here.   glipiZIDE 5 MG tablet Commonly known as: GLUCOTROL Take 5 mg by mouth 2 (two) times daily.   hydrochlorothiazide 12.5 MG capsule Commonly known as: MICROZIDE Take 12.5 mg by mouth daily.   Lantus 100 UNIT/ML injection Generic drug: insulin glargine Inject 40 Units into the skin daily.   levocetirizine 5 MG tablet Commonly known as: XYZAL Take 5 mg by mouth daily.   lisinopril 20 MG tablet Commonly known as: ZESTRIL Take 20 mg by mouth daily.   rosuvastatin 20 MG tablet Commonly known as: CRESTOR Take 1 tablet (20 mg total) by mouth daily. Start taking on: June 20, 2020 What changed:   medication strength  how much to take       Follow-up Information    [facility] neurology. Schedule an appointment as soon as possible for a visit in 4 week(s).        [facility] Follow up.   Why: [facility] is the home health agency--they will contact you for the first home visit. Contact information: [address] [place] [address] [phone]              Allergies  Allergen Reactions  . Other Anaphylaxis, Rash and Hives    Allergy to enzyme injected in animals    Consultations: Neurology  Discharge Exam: BP (!) 144/78 (BP Location: Right Arm)   Pulse 79   Temp 98.9 F (37.2 C) (Oral)   Resp 18   Ht 5\' 1"  (1.549 m)   Wt 56.7 kg   SpO2 98%   BMI  23.62 kg/m  General appearance: alert, cooperative and no distress Head: Normocephalic, without obvious abnormality, atraumatic Eyes: EOMI Lungs: clear to auscultation bilaterally Heart: regular rate and rhythm and S1, S2 normal Abdomen: normal findings: bowel sounds normal and soft, non-tender Extremities: no edema Skin: mobility and turgor normal Neurologic: Right lower extremity paresthesia per patient. No focal weakness  The results of significant diagnostics from this hospitalization (including imaging, microbiology, ancillary and laboratory) are listed below for reference.   Microbiology: Recent Results (from the past 240 hour(s))  Resp Panel by RT-PCR (Flu A&B, Covid) Nasopharyngeal Swab     Status: None   Collection Time: 06/16/20  9:05 PM   Specimen: Nasopharyngeal Swab; Nasopharyngeal(NP) swabs in vial transport medium  Result Value Ref Range Status   SARS Coronavirus 2 by RT PCR NEGATIVE NEGATIVE Final    Comment: (NOTE) SARS-CoV-2 target nucleic acids are NOT  DETECTED.  The SARS-CoV-2 RNA is generally detectable in upper respiratory specimens during the acute phase of infection. The lowest concentration of SARS-CoV-2 viral copies this assay can detect is 138 copies/mL. A negative result does not preclude SARS-Cov-2 infection and should not be used as the sole basis for treatment or other patient management decisions. A negative result may occur with  improper specimen collection/handling, submission of specimen other than nasopharyngeal swab, presence of viral mutation(s) within the areas targeted by this assay, and inadequate number of viral copies(<138 copies/mL). A negative result must be combined with clinical observations, patient history, and epidemiological information. The expected result is Negative.  Fact Sheet for Patients:  BloggerCourse.com  Fact Sheet for Healthcare Providers:  SeriousBroker.it  This  test is no t yet approved or cleared by the [place] FDA and  has been authorized for detection and/or diagnosis of SARS-CoV-2 by FDA under an Emergency Use Authorization (EUA). This EUA will remain  in effect (meaning this test can be used) for the duration of the COVID-19 declaration under Section 564(b)(1) of the Act, 21 U.S.C.section 360bbb-3(b)(1), unless the authorization is terminated  or revoked sooner.       Influenza A by PCR NEGATIVE NEGATIVE Final   Influenza B by PCR NEGATIVE NEGATIVE Final    Comment: (NOTE) The Xpert Xpress SARS-CoV-2/FLU/RSV plus assay is intended as an aid in the diagnosis of influenza from Nasopharyngeal swab specimens and should not be used as a sole basis for treatment. Nasal washings and aspirates are unacceptable for Xpert Xpress SARS-CoV-2/FLU/RSV testing.  Fact Sheet for Patients: BloggerCourse.com  Fact Sheet for Healthcare Providers: SeriousBroker.it  This test is not yet approved or cleared by the [place] FDA and has been authorized for detection and/or diagnosis of SARS-CoV-2 by FDA under an Emergency Use Authorization (EUA). This EUA will remain in effect (meaning this test can be used) for the duration of the COVID-19 declaration under Section 564(b)(1) of the Act, 21 U.S.C. section 360bbb-3(b)(1), unless the authorization is terminated or revoked.  Performed at [facility], [address], [place], [place] [address]      Labs: BNP (last 3 results) No results for input(s): BNP in the last 8760 hours. Basic Metabolic Panel: Recent Labs  Lab 06/16/20 1834 06/16/20 2134 06/18/20 0254 06/18/20 1211 06/19/20 0449  NA 131* 136 137  --  136  K 3.9 3.9 3.9  --  3.9  CL 99 99 104  --  104  CO2 20*  --  22  --  23  GLUCOSE 530* 445* 215* 424* 235*  BUN 14 17 16   --  12  CREATININE 0.94 0.60 0.84  --  0.81  CALCIUM 9.2  --  8.9  --  8.9  MG  --   --  1.8  --   1.8   Liver Function Tests: Recent Labs  Lab 06/16/20 1834  AST 25  ALT 18  ALKPHOS 77  BILITOT 0.5  PROT 6.6  ALBUMIN 3.7   No results for input(s): LIPASE, AMYLASE in the last 168 hours. No results for input(s): AMMONIA in the last 168 hours. CBC: Recent Labs  Lab 06/16/20 1834 06/16/20 2134 06/18/20 0254 06/19/20 0449  WBC 9.7  --  8.4 7.8  NEUTROABS 7.3  --   --   --   HGB 14.7 16.3* 13.0 13.3  HCT 45.1 48.0* 39.1 39.4  MCV 90.7  --  90.5 91.0  PLT 213  --  202 199  Cardiac Enzymes: No results for input(s): CKTOTAL, CKMB, CKMBINDEX, TROPONINI in the last 168 hours. BNP: Invalid input(s): POCBNP CBG: Recent Labs  Lab 06/18/20 1830 06/18/20 2205 06/19/20 0503 06/19/20 0647 06/19/20 1155  GLUCAP 108* 228* 252* 242* 292*   D-Dimer No results for input(s): DDIMER in the last 72 hours. Hgb A1c Recent Labs    06/16/20 1834  HGBA1C 9.5*   Lipid Profile Recent Labs    06/17/20 0804  CHOL 256*  HDL 36*  LDLCALC 175*  TRIG 225*  CHOLHDL 7.1   Thyroid function studies Recent Labs    06/17/20 2039  TSH 1.485   Anemia work up No results for input(s): VITAMINB12, FOLATE, FERRITIN, TIBC, IRON, RETICCTPCT in the last 72 hours. Urinalysis    Component Value Date/Time   COLORURINE YELLOW 06/17/2020 0926   APPEARANCEUR CLEAR 06/17/2020 0926   LABSPEC 1.024 06/17/2020 0926   PHURINE 5.0 06/17/2020 0926   GLUCOSEU >=500 (A) 06/17/2020 0926   HGBUR NEGATIVE 06/17/2020 0926   BILIRUBINUR NEGATIVE 06/17/2020 0926   KETONESUR NEGATIVE 06/17/2020 0926   PROTEINUR NEGATIVE 06/17/2020 0926   NITRITE NEGATIVE 06/17/2020 0926   LEUKOCYTESUR NEGATIVE 06/17/2020 0926   Sepsis Labs Invalid input(s): PROCALCITONIN,  WBC,  LACTICIDVEN Microbiology Recent Results (from the past 240 hour(s))  Resp Panel by RT-PCR (Flu A&B, Covid) Nasopharyngeal Swab     Status: None   Collection Time: 06/16/20  9:05 PM   Specimen: Nasopharyngeal Swab; Nasopharyngeal(NP) swabs  in vial transport medium  Result Value Ref Range Status   SARS Coronavirus 2 by RT PCR NEGATIVE NEGATIVE Final    Comment: (NOTE) SARS-CoV-2 target nucleic acids are NOT DETECTED.  The SARS-CoV-2 RNA is generally detectable in upper respiratory specimens during the acute phase of infection. The lowest concentration of SARS-CoV-2 viral copies this assay can detect is 138 copies/mL. A negative result does not preclude SARS-Cov-2 infection and should not be used as the sole basis for treatment or other patient management decisions. A negative result may occur with  improper specimen collection/handling, submission of specimen other than nasopharyngeal swab, presence of viral mutation(s) within the areas targeted by this assay, and inadequate number of viral copies(<138 copies/mL). A negative result must be combined with clinical observations, patient history, and epidemiological information. The expected result is Negative.  Fact Sheet for Patients:  BloggerCourse.comhttps://www.fda.gov/media/152166/download  Fact Sheet for Healthcare Providers:  SeriousBroker.ithttps://www.fda.gov/media/152162/download  This test is no t yet approved or cleared by the [place] FDA and  has been authorized for detection and/or diagnosis of SARS-CoV-2 by FDA under an Emergency Use Authorization (EUA). This EUA will remain  in effect (meaning this test can be used) for the duration of the COVID-19 declaration under Section 564(b)(1) of the Act, 21 U.S.C.section 360bbb-3(b)(1), unless the authorization is terminated  or revoked sooner.       Influenza A by PCR NEGATIVE NEGATIVE Final   Influenza B by PCR NEGATIVE NEGATIVE Final    Comment: (NOTE) The Xpert Xpress SARS-CoV-2/FLU/RSV plus assay is intended as an aid in the diagnosis of influenza from Nasopharyngeal swab specimens and should not be used as a sole basis for treatment. Nasal washings and aspirates are unacceptable for Xpert Xpress  SARS-CoV-2/FLU/RSV testing.  Fact Sheet for Patients: BloggerCourse.comhttps://www.fda.gov/media/152166/download  Fact Sheet for Healthcare Providers: SeriousBroker.ithttps://www.fda.gov/media/152162/download  This test is not yet approved or cleared by the [place] FDA and has been authorized for detection and/or diagnosis of SARS-CoV-2 by FDA under an Emergency Use Authorization (EUA). This EUA will remain  in effect (meaning this test can be used) for the duration of the COVID-19 declaration under Section 564(b)(1) of the Act, 21 U.S.C. section 360bbb-3(b)(1), unless the authorization is terminated or revoked.  Performed at [facility], [address], [place], [place] [address]     Procedures/Studies: CT Head Wo Contrast  Result Date: 06/16/2020 CLINICAL DATA:  59 year old female with numbness, tingling, and paresthesia. EXAM: CT HEAD WITHOUT CONTRAST TECHNIQUE: Contiguous axial images were obtained from the base of the skull through the vertex without intravenous contrast. COMPARISON:  None. FINDINGS: Brain: The ventricles and sulci appropriate size for patient's age. There is a 1.3 x 0.9 cm hypodense area in the left thalamus consistent with an infarct, likely acute or subacute. Clinical correlation and further evaluation with MRI is recommended. 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 There is no acute intracranial hemorrhage. No mass effect or midline shift. No extra-axial fluid collection. Vascular: No hyperdense vessel or unexpected calcification. Skull: Normal. Negative for fracture or focal lesion. Sinuses/Orbits: No acute finding. Other: None IMPRESSION: 1. Left thalamic infarct, likely acute or subacute. 2. No acute intracranial hemorrhage. 3. 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 These results were called by telephone at the time of interpretation on 06/16/2020 at 8:21 pm to provider [facility] , who verbally acknowledged these results. Electronically Signed   By: Elgie Collard M.D.   On: 06/16/2020 20:28   MR ANGIO HEAD WO CONTRAST  Result Date: 06/16/2020 CLINICAL DATA:  Acute neurologic deficit EXAM: MR HEAD WITHOUT CONTRAST MR CIRCLE OF WILLIS WITHOUT CONTRAST MRA OF THE NECK WITHOUT AND WITH CONTRAST TECHNIQUE: Multiplanar, multiecho pulse sequences of the brain, circle of willis and surrounding structures were obtained without intravenous contrast. Angiographic images of the neck were obtained using MRA technique without and with intravenous contrast. CONTRAST:  5.22mL GADAVIST GADOBUTROL 1 MMOL/ML IV SOLN COMPARISON:  None. FINDINGS: MR HEAD FINDINGS Brain: Small focus of acute ischemia in the left thalamus. Multifocal hyperintense T2-weighted signal within the white matter. There is an old right occipital cortical infarct. Multiple old corona radiata small vessel infarcts. Normal volume of CSF spaces. No chronic microhemorrhage. Normal midline structures. Vascular: Major flow voids are preserved. Skull and upper cervical spine: Normal calvarium and skull base. Visualized upper cervical spine and soft tissues are normal. Sinuses/Orbits:No paranasal sinus fluid levels or advanced mucosal thickening. No mastoid or middle ear effusion. Normal orbits. MR CIRCLE OF WILLIS FINDINGS POSTERIOR CIRCULATION: --Vertebral arteries: Normal --Inferior cerebellar arteries: Normal. --Basilar artery: Normal. --Superior cerebellar arteries: Normal. --Posterior cerebral arteries: Normal. ANTERIOR CIRCULATION: --Intracranial internal carotid arteries: Normal. --Anterior cerebral arteries (ACA): Normal. --Middle cerebral arteries (MCA): Normal. ANATOMIC VARIANTS: Fetal origin of the left PCA. Both P comm's are present. MRA NECK FINDINGS There is an aberrant right subclavian artery. The visualized subclavian arteries are both patent. There is no carotid artery stenosis. The vertebral arteries are left-dominant. Both origins are clearly patent. There is no stenosis or other abnormality.  IMPRESSION: 1. Small focus of acute ischemia in the left thalamus. No hemorrhage or mass effect. 2. Old right occipital cortical infarct and multiple old corona radiata small vessel infarcts. 3. Normal MRA of the head and neck. Electronically Signed   By: Deatra Robinson M.D.   On: 06/16/2020 22:49   MR ANGIO NECK W WO CONTRAST  Result Date: 06/16/2020 CLINICAL DATA:  Acute neurologic deficit EXAM: MR HEAD WITHOUT CONTRAST MR CIRCLE OF WILLIS WITHOUT CONTRAST MRA OF THE NECK WITHOUT AND WITH CONTRAST TECHNIQUE: Multiplanar,  multiecho pulse sequences of the brain, circle of willis and surrounding structures were obtained without intravenous contrast. Angiographic images of the neck were obtained using MRA technique without and with intravenous contrast. CONTRAST:  5.8mL GADAVIST GADOBUTROL 1 MMOL/ML IV SOLN COMPARISON:  None. FINDINGS: MR HEAD FINDINGS Brain: Small focus of acute ischemia in the left thalamus. Multifocal hyperintense T2-weighted signal within the white matter. There is an old right occipital cortical infarct. Multiple old corona radiata small vessel infarcts. Normal volume of CSF spaces. No chronic microhemorrhage. Normal midline structures. Vascular: Major flow voids are preserved. Skull and upper cervical spine: Normal calvarium and skull base. Visualized upper cervical spine and soft tissues are normal. Sinuses/Orbits:No paranasal sinus fluid levels or advanced mucosal thickening. No mastoid or middle ear effusion. Normal orbits. MR CIRCLE OF WILLIS FINDINGS POSTERIOR CIRCULATION: --Vertebral arteries: Normal --Inferior cerebellar arteries: Normal. --Basilar artery: Normal. --Superior cerebellar arteries: Normal. --Posterior cerebral arteries: Normal. ANTERIOR CIRCULATION: --Intracranial internal carotid arteries: Normal. --Anterior cerebral arteries (ACA): Normal. --Middle cerebral arteries (MCA): Normal. ANATOMIC VARIANTS: Fetal origin of the left PCA. Both P comm's are present. MRA NECK  FINDINGS There is an aberrant right subclavian artery. The visualized subclavian arteries are both patent. There is no carotid artery stenosis. The vertebral arteries are left-dominant. Both origins are clearly patent. There is no stenosis or other abnormality. IMPRESSION: 1. Small focus of acute ischemia in the left thalamus. No hemorrhage or mass effect. 2. Old right occipital cortical infarct and multiple old corona radiata small vessel infarcts. 3. Normal MRA of the head and neck. Electronically Signed   By: Deatra Robinson M.D.   On: 06/16/2020 22:49   MR BRAIN WO CONTRAST  Result Date: 06/16/2020 CLINICAL DATA:  Acute neurologic deficit EXAM: MR HEAD WITHOUT CONTRAST MR CIRCLE OF WILLIS WITHOUT CONTRAST MRA OF THE NECK WITHOUT AND WITH CONTRAST TECHNIQUE: Multiplanar, multiecho pulse sequences of the brain, circle of willis and surrounding structures were obtained without intravenous contrast. Angiographic images of the neck were obtained using MRA technique without and with intravenous contrast. CONTRAST:  5.27mL GADAVIST GADOBUTROL 1 MMOL/ML IV SOLN COMPARISON:  None. FINDINGS: MR HEAD FINDINGS Brain: Small focus of acute ischemia in the left thalamus. Multifocal hyperintense T2-weighted signal within the white matter. There is an old right occipital cortical infarct. Multiple old corona radiata small vessel infarcts. Normal volume of CSF spaces. No chronic microhemorrhage. Normal midline structures. Vascular: Major flow voids are preserved. Skull and upper cervical spine: Normal calvarium and skull base. Visualized upper cervical spine and soft tissues are normal. Sinuses/Orbits:No paranasal sinus fluid levels or advanced mucosal thickening. No mastoid or middle ear effusion. Normal orbits. MR CIRCLE OF WILLIS FINDINGS POSTERIOR CIRCULATION: --Vertebral arteries: Normal --Inferior cerebellar arteries: Normal. --Basilar artery: Normal. --Superior cerebellar arteries: Normal. --Posterior cerebral arteries:  Normal. ANTERIOR CIRCULATION: --Intracranial internal carotid arteries: Normal. --Anterior cerebral arteries (ACA): Normal. --Middle cerebral arteries (MCA): Normal. ANATOMIC VARIANTS: Fetal origin of the left PCA. Both P comm's are present. MRA NECK FINDINGS There is an aberrant right subclavian artery. The visualized subclavian arteries are both patent. There is no carotid artery stenosis. The vertebral arteries are left-dominant. Both origins are clearly patent. There is no stenosis or other abnormality. IMPRESSION: 1. Small focus of acute ischemia in the left thalamus. No hemorrhage or mass effect. 2. Old right occipital cortical infarct and multiple old corona radiata small vessel infarcts. 3. Normal MRA of the head and neck. Electronically Signed   By: Deatra Robinson M.D.   On: 06/16/2020  22:49   ECHOCARDIOGRAM COMPLETE BUBBLE STUDY  Result Date: 06/17/2020    ECHOCARDIOGRAM REPORT   Patient Name:   MADELYNNE LASKER Date of Exam: 06/17/2020 Medical Rec #:  [id number]       Height:       61.0 in Accession #:    [id number]      Weight:       125.0 lb Date of Birth:  12-May-1961       BSA:          1.547 m Patient Age:    58 years        BP:           140/78 mmHg Patient Gender: F               HR:           74 bpm. Exam Location:  Inpatient Procedure: 2D Echo and Saline Contrast Bubble Study Indications:    stroke 434.91  History:        Patient has no prior history of Echocardiogram examinations.                 Risk Factors:Hypertension and Diabetes.  Sonographer:    Delcie Roch Referring Phys: [id number] Deno Lunger [place]AL[place] IMPRESSIONS  1. Left ventricular ejection fraction, by estimation, is 55 to 60%. The left ventricle has normal function. The left ventricle has no regional wall motion abnormalities. Left ventricular diastolic parameters are indeterminate.  2. Right ventricular systolic function is normal. The right ventricular size is normal. Tricuspid regurgitation signal is inadequate for assessing PA  pressure.  3. The mitral valve is grossly normal. Trivial mitral valve regurgitation. No evidence of mitral stenosis.  4. The aortic valve is grossly normal. Aortic valve regurgitation is not visualized. No aortic stenosis is present.  5. The inferior vena cava is normal in size with greater than 50% respiratory variability, suggesting right atrial pressure of 3 mmHg.  6. Agitated saline contrast bubble study was negative, with no evidence of any interatrial shunt. Comparison(s): No prior Echocardiogram. Conclusion(s)/Recommendation(s): Normal biventricular function without evidence of hemodynamically significant valvular heart disease. No intracardiac source of embolism detected on this transthoracic study. A transesophageal echocardiogram is recommended to exclude cardiac source of embolism if clinically indicated. FINDINGS  Left Ventricle: Left ventricular ejection fraction, by estimation, is 55 to 60%. The left ventricle has normal function. The left ventricle has no regional wall motion abnormalities. The left ventricular internal cavity size was normal in size. There is  no left ventricular hypertrophy. Left ventricular diastolic parameters are indeterminate. Right Ventricle: The right ventricular size is normal. No increase in right ventricular wall thickness. Right ventricular systolic function is normal. Tricuspid regurgitation signal is inadequate for assessing PA pressure. Left Atrium: Left atrial size was normal in size. Right Atrium: Right atrial size was normal in size. Pericardium: There is no evidence of pericardial effusion. Presence of pericardial fat pad. Mitral Valve: The mitral valve is grossly normal. Trivial mitral valve regurgitation. No evidence of mitral valve stenosis. Tricuspid Valve: The tricuspid valve is grossly normal. Tricuspid valve regurgitation is trivial. No evidence of tricuspid stenosis. Aortic Valve: The aortic valve is grossly normal. Aortic valve regurgitation is not  visualized. No aortic stenosis is present. Pulmonic Valve: The pulmonic valve was not well visualized. Pulmonic valve regurgitation is not visualized. Aorta: The aortic root, ascending aorta and aortic arch are all structurally normal, with no evidence of dilitation or obstruction. Venous: The inferior vena cava is normal  in size with greater than 50% respiratory variability, suggesting right atrial pressure of 3 mmHg. IAS/Shunts: The atrial septum is grossly normal. Agitated saline contrast was given intravenously to evaluate for intracardiac shunting. Agitated saline contrast bubble study was negative, with no evidence of any interatrial shunt.  LEFT VENTRICLE PLAX 2D LVIDd:         4.70 cm  Diastology LVIDs:         3.30 cm  LV e' medial:    5.22 cm/s LV PW:         1.00 cm  LV E/e' medial:  17.8 LV IVS:        1.10 cm  LV e' lateral:   6.42 cm/s LVOT diam:     2.00 cm  LV E/e' lateral: 14.4 LV SV:         58 LV SV Index:   37 LVOT Area:     3.14 cm  RIGHT VENTRICLE             IVC RV S prime:     12.20 cm/s  IVC diam: 1.30 cm TAPSE (M-mode): 1.8 cm LEFT ATRIUM             Index       RIGHT ATRIUM           Index LA diam:        3.60 cm 2.33 cm/m  RA Area:     11.60 cm LA Vol (A2C):   33.7 ml 21.79 ml/m RA Volume:   24.80 ml  16.03 ml/m LA Vol (A4C):   36.9 ml 23.86 ml/m LA Biplane Vol: 35.7 ml 23.08 ml/m  AORTIC VALVE LVOT Vmax:   93.80 cm/s LVOT Vmean:  60.200 cm/s LVOT VTI:    0.184 m  AORTA Ao Root diam: 3.10 cm Ao Asc diam:  3.00 cm MITRAL VALVE MV Area (PHT): 3.48 cm     SHUNTS MV Decel Time: 218 msec     Systemic VTI:  0.18 m MV E velocity: 92.70 cm/s   Systemic Diam: 2.00 cm MV A velocity: 110.00 cm/s MV E/A ratio:  0.84 Jodelle Red MD Electronically signed by Jodelle Red MD Signature Date/Time: 06/17/2020/1:34:44 PM    Final      Time coordinating discharge: Over 30 minutes    Lewie Chamber, MD  [facility] 06/19/2020, 2:13 PM

## 2020-06-26 ENCOUNTER — Other Ambulatory Visit: Payer: Self-pay | Admitting: *Deleted

## 2020-06-26 NOTE — Patient Outreach (Signed)
[facility]) Care Management  06/26/2020  Phil Corti 01-Feb-1961 [id number]   Referral received from care management assistant requesting call to member to answer questions about recent discharge (admitted 11/29-12/2 for stroke).  Call placed, no answer, HIPAA compliant voice message left.  Will follow up within the next 3-4 business days.  Kemper Durie, [place], MSN [facility] Care Management  [facility] Manager [phone]

## 2020-06-27 ENCOUNTER — Other Ambulatory Visit: Payer: Self-pay | Admitting: *Deleted

## 2020-06-27 NOTE — Patient Outreach (Addendum)
[facility] Care Management  06/27/2020  Keylin Ferryman 09/04/1960 [id number]   RED ON EMMI ALERT - Stroke Day # 6 Date: 12/9 Red Alert Reason: smoked or been around smoke   Notified today that member triggered red on EMMI dashboard on 12/9 for above alert.  Call was placed to member on day of red alert, no answer.  Will follow up within the next 3 business days as planned.  Kemper Durie, [place], MSN [facility] Care Management  [facility] Manager [phone]

## 2020-07-02 ENCOUNTER — Other Ambulatory Visit: Payer: Self-pay | Admitting: *Deleted

## 2020-07-02 NOTE — Patient Outreach (Signed)
[facility]) Care Management  07/02/2020  Kim Boyd [birth year] [id number]   RED ON EMMI ALERT - Stroke Day # 6 Date: 12/9 Red Alert Reason: smoked or been around smoke  Outreach attempt #2, successful.  Identity verified.  This care manager introduced self and stated purpose of call.  [facility] care management services explained.    Member report she is progressing well since her discharge.  She lives alone and is mostly independent but depends on daughter's assistance (lives close by).  She has some right sided weakness from the stroke, daughter helps with management of insulin as she is unable to hold the pen.  Otherwise, state she is able to bathe and dress with minimal assistance.  She has been in contact with her PCP office, will have follow up on 1/4.  There was a referral placed to her local neurology office but was advised that they did not cover her insurance.  She has been in contact with insurance company to inquire about a list of neurologists within her network.  State provider has ordered home health for PT but she is unsure what agency and when they will start.  Call was placed to PCP office, notified that [facility] has been assigned to case.  Per their office, they will reach out to member to schedule home visit for tomorrow or Friday.    Discussed management of chronic medical conditions, including HTN and DM, decreasing risk of stroke.  She does monitor glucose and blood pressure daily, have not checked today.  State blood sugar yesterday  Fasting was 86 and before bedtime was 157.  Report blood pressure had been elevated, 190s/100's, discussed with PCP and Lisinopril was increased.  She will continue to monitor daily for trends.  As noted above, she does continue to smoke, state she only had 2 cigarettes yesterday. This is a decrease for her, verbalizes understanding of increased risk of stroke when smoking.  Discussed diet management for control of DM  and HTN, verbalizes understanding of management, denies any urgent concerns.  Plan: Will follow up with member within the next 2 weeks.  Will send EMMI education regarding management of DM, HTN, and decreasing risk of stroke.  Kemper Durie, [place], MSN [facility] Care Management  [facility] Manager [phone]

## 2020-07-16 ENCOUNTER — Other Ambulatory Visit: Payer: Self-pay | Admitting: *Deleted

## 2020-07-16 NOTE — Patient Outreach (Signed)
[facility]) Care Management  07/16/2020  Kim Boyd Sep 23, 1960 [id number]   Call placed to member to follow up on start of home health services and neurology appointment.  No answer, recording state member is unable to receive calls at this time.  Will follow up with 2nd attempt within the next 3-4 business days.  Kemper Durie, [place], MSN [facility] Care Management  [facility] Manager [phone]

## 2020-07-22 ENCOUNTER — Other Ambulatory Visit: Payer: Self-pay | Admitting: *Deleted

## 2020-07-22 NOTE — Patient Outreach (Signed)
[facility]) Care Management  07/22/2020  Osceola Holian Nov 18, 1960 [id number]   Outgoing call placed to member to follow up on start of home health services and follow up appointments.  State she will see PCP today, still has not been able to reach out to insurance regarding approved neurologist due to not having minutes on her phone.  Report this is no longer an issue and will reach out to neurology today.  She will also discuss with PCP during office visit on Thursday.  Confirms home health has started, PT's last home visit was yesterday, nurse will be in the home Thursday.  Denies any urgent concerns, encouraged to contact this care manager with questions.  Will close case at this time as no further needs identified.  Kemper Durie, [place], MSN [facility] Care Management  [facility] Manager [phone]

## 2020-09-24 ENCOUNTER — Other Ambulatory Visit: Payer: Self-pay

## 2020-09-24 ENCOUNTER — Encounter: Payer: Self-pay | Admitting: Neurology

## 2020-09-24 ENCOUNTER — Ambulatory Visit (INDEPENDENT_AMBULATORY_CARE_PROVIDER_SITE_OTHER): Payer: 59 | Admitting: Neurology

## 2020-09-24 VITALS — BP 121/63 | Ht 61.0 in | Wt 174.8 lb

## 2020-09-24 DIAGNOSIS — I6381 Other cerebral infarction due to occlusion or stenosis of small artery: Secondary | ICD-10-CM

## 2020-09-24 DIAGNOSIS — I639 Cerebral infarction, unspecified: Secondary | ICD-10-CM

## 2020-09-24 DIAGNOSIS — R202 Paresthesia of skin: Secondary | ICD-10-CM

## 2020-09-24 DIAGNOSIS — F1910 Other psychoactive substance abuse, uncomplicated: Secondary | ICD-10-CM | POA: Diagnosis not present

## 2020-09-24 NOTE — Patient Instructions (Signed)
I had a long d/w patient about her recent stroke, risk for recurrent stroke/TIAs, personally independently reviewed imaging studies and stroke evaluation results and answered questions.Continue Plavix 75 mg daily for secondary stroke prevention and maintain strict control of hypertension with blood pressure goal below 130/90, diabetes with hemoglobin A1c goal below 6.5% and lipids with LDL cholesterol goal below 70 mg/dL. I also advised the patient to eat a healthy diet with plenty of whole grains, cereals, fruits and vegetables, exercise regularly and maintain ideal body weight.  I advised the patient to quit smoking cigarettes, marijuana and using cocaine.  Check lipid profile and hemoglobin A1c today.  Followup in the future with my nurse practitioner Shanda Bumps in 3 months or call earlier if necessary.  Stroke Prevention Some medical conditions and behaviors are associated with a higher chance of having a stroke. You can help prevent a stroke by making nutrition, lifestyle, and other changes, including managing any medical conditions you may have. What nutrition changes can be made?  Eat healthy foods. You can do this by: ? Choosing foods high in fiber, such as fresh fruits and vegetables and whole grains. ? Eating at least 5 or more servings of fruits and vegetables a day. Try to fill half of your plate at each meal with fruits and vegetables. ? Choosing lean protein foods, such as lean cuts of meat, poultry without skin, fish, tofu, beans, and nuts. ? Eating low-fat dairy products. ? Avoiding foods that are high in salt (sodium). This can help lower blood pressure. ? Avoiding foods that have saturated fat, trans fat, and cholesterol. This can help prevent high cholesterol. ? Avoiding processed and premade foods.  Follow your health care provider's specific guidelines for losing weight, controlling high blood pressure (hypertension), lowering high cholesterol, and managing diabetes. These may  include: ? Reducing your daily calorie intake. ? Limiting your daily sodium intake to 1,500 milligrams (mg). ? Using only healthy fats for cooking, such as olive oil, canola oil, or sunflower oil. ? Counting your daily carbohydrate intake.   What lifestyle changes can be made?  Maintain a healthy weight. Talk to your health care provider about your ideal weight.  Get at least 30 minutes of moderate physical activity at least 5 days a week. Moderate activity includes brisk walking, biking, and swimming.  Do not use any products that contain nicotine or tobacco, such as cigarettes and e-cigarettes. If you need help quitting, ask your health care provider. It may also be helpful to avoid exposure to secondhand smoke.  Limit alcohol intake to no more than 1 drink a day for nonpregnant women and 2 drinks a day for men. One drink equals 12 oz of beer, 5 oz of wine, or 1 oz of hard liquor.  Stop any illegal drug use.  Avoid taking birth control pills. Talk to your health care provider about the risks of taking birth control pills if: ? You are over 71 years old. ? You smoke. ? You get migraines. ? You have ever had a blood clot. What other changes can be made?  Manage your cholesterol levels. ? Eating a healthy diet is important for preventing high cholesterol. If cholesterol cannot be managed through diet alone, you may also need to take medicines. ? Take any prescribed medicines to control your cholesterol as told by your health care provider.  Manage your diabetes. ? Eating a healthy diet and exercising regularly are important parts of managing your blood sugar. If your blood sugar cannot  be managed through diet and exercise, you may need to take medicines. ? Take any prescribed medicines to control your diabetes as told by your health care provider.  Control your hypertension. ? To reduce your risk of stroke, try to keep your blood pressure below 130/80. ? Eating a healthy diet and  exercising regularly are an important part of controlling your blood pressure. If your blood pressure cannot be managed through diet and exercise, you may need to take medicines. ? Take any prescribed medicines to control hypertension as told by your health care provider. ? Ask your health care provider if you should monitor your blood pressure at home. ? Have your blood pressure checked every year, even if your blood pressure is normal. Blood pressure increases with age and some medical conditions.  Get evaluated for sleep disorders (sleep apnea). Talk to your health care provider about getting a sleep evaluation if you snore a lot or have excessive sleepiness.  Take over-the-counter and prescription medicines only as told by your health care provider. Aspirin or blood thinners (antiplatelets or anticoagulants) may be recommended to reduce your risk of forming blood clots that can lead to stroke.  Make sure that any other medical conditions you have, such as atrial fibrillation or atherosclerosis, are managed. What are the warning signs of a stroke? The warning signs of a stroke can be easily remembered as BEFAST.  B is for balance. Signs include: ? Dizziness. ? Loss of balance or coordination. ? Sudden trouble walking.  E is for eyes. Signs include: ? A sudden change in vision. ? Trouble seeing.  F is for face. Signs include: ? Sudden weakness or numbness of the face. ? The face or eyelid drooping to one side.  A is for arms. Signs include: ? Sudden weakness or numbness of the arm, usually on one side of the body.  S is for speech. Signs include: ? Trouble speaking (aphasia). ? Trouble understanding.  T is for time. ? These symptoms may represent a serious problem that is an emergency. Do not wait to see if the symptoms will go away. Get medical help right away. Call your local emergency services (911 in the [place] Do not drive yourself to the hospital.  Other signs of stroke  may include: ? A sudden, severe headache with no known cause. ? Nausea or vomiting. ? Seizure. Where to find more information For more information, visit:  American Stroke Association: www.strokeassociation.org  National Stroke Association: www.stroke.org Summary  You can prevent a stroke by eating healthy, exercising, not smoking, limiting alcohol intake, and managing any medical conditions you may have.  Do not use any products that contain nicotine or tobacco, such as cigarettes and e-cigarettes. If you need help quitting, ask your health care provider. It may also be helpful to avoid exposure to secondhand smoke.  Remember BEFAST for warning signs of stroke. Get help right away if you or a loved one has any of these signs. This information is not intended to replace advice given to you by your health care provider. Make sure you discuss any questions you have with your health care provider. Document Revised: 06/17/2017 Document Reviewed: 08/10/2016 Elsevier Patient Education  2021 [employer].

## 2020-09-24 NOTE — Progress Notes (Signed)
[facility] [address] [place] [address] [phone]       OFFICE CONSULT NOTE  [name] Date of Birth:  [birth year] Medical Record Number:  [id number]   Referring MD: Renaldo Harrison, DO  Reason for Referral: Stroke HPI: [name] is a 60 year old Caucasian lady seen today for initial office consultation visit for stroke.  History is obtained from the patient, review of electronic medical records and I personally reviewed pertinent imaging films in PACS.  She has past medical history of diabetes, hyperlipidemia, hypertension, tobacco and substance abuse.  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  She presented on 06/17/2020 with sudden onset of right-sided numbness weakness and decreased coordination.  She also had some trouble walking and stumbled and was off balance.  She had some preceding nausea for 2 days and blurred vision and headache.  She had not been compliant with her medications.  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  MRI of the brain and neck both did not show large vessel stenosis or occlusion.  Echocardiogram showed 55 to 60% ejection fraction without cardiac source of embolism.  Hemoglobin A1c was elevated at 9.5 and LDL cholesterol of 175 mg percent.  Urine tox was positive for marijuana and cocaine.  Patient was started on dual antiplatelet therapy aspirin and Plavix for 3 weeks following which aspirin was stopped and he has stayed on Plavix alone.  She still has residual numbness in the right face arm and leg as well as slight dragging of her right leg and diminished fine motor skills in the hand but she is getting better.  She states she is more compliant with her medications.  She is on Plavix tolerating well without bruising or  bleeding.  Her blood pressure is now better controlled and today it is 121/63.  Sugars continue to be elevated primary physician has recently increased the dose of Lantus.  She is tolerating Crestor well without any side effects.  She has finished home physical and occupational therapy.  She is living alone at home and is independent in activities of daily living but is not driving.  She has no new complaints.  ROS:   14 system review of systems is positive for numbness, weakness decreased coordination and gait difficulty all other systems negative  PMH:  Past Medical History:  Diagnosis Date  . Diabetes mellitus without complication (HCC)   . Hypertension   . Stroke [facility])    April 2017    Social History:  Social History   Socioeconomic History  . Marital status: Widowed    Spouse name: Not on file  . Number of children: Not on file  . Years of education: Not on file  . Highest education level: Not on file  Occupational History  . Occupation: disabled  Tobacco Use  . Smoking status: Current Every Day Smoker    Packs/day: 0.50    Types: Cigarettes  . Smokeless tobacco: Never Used  Substance and Sexual Activity  . Alcohol use: Yes    Alcohol/week: 5.0 standard drinks    Types: 5 Shots of liquor per week    Comment: 5+ weekly  . Drug use: Yes    Types: Marijuana  . Sexual activity: Not on file  Other Topics Concern  . Not on  file  Social History Narrative   Lives alone   Left handed   Drinks 2-3 cups caffeine daily   Social Determinants of Health   Financial Resource Strain: Not on file  Food Insecurity: Not on file  Transportation Needs: Not on file  Physical Activity: Not on file  Stress: Not on file  Social Connections: Not on file  Intimate Partner Violence: Not on file    Medications:   Current Outpatient Medications on File Prior to Visit  Medication Sig Dispense Refill  . amitriptyline (ELAVIL) 25 MG tablet Take 1 tablet by mouth daily.    . clopidogrel  (PLAVIX) 75 MG tablet Take 1 tablet (75 mg total) by mouth daily. 90 tablet 3  . gabapentin (NEURONTIN) 300 MG capsule Take 300 mg by mouth 3 (three) times daily.    Marland Kitchen glipiZIDE (GLUCOTROL) 5 MG tablet Take 5 mg by mouth 2 (two) times daily.    . hydrochlorothiazide (MICROZIDE) 12.5 MG capsule Take 12.5 mg by mouth daily.    Marland Kitchen LANTUS 100 UNIT/ML injection Inject 40 Units into the skin daily.   0  . levocetirizine (XYZAL) 5 MG tablet Take 5 mg by mouth daily.    Marland Kitchen lisinopril (ZESTRIL) 20 MG tablet Take 20 mg by mouth daily.    . rosuvastatin (CRESTOR) 20 MG tablet Take 1 tablet (20 mg total) by mouth daily. 90 tablet 3   No current facility-administered medications on file prior to visit.    Allergies:   Allergies  Allergen Reactions  . Other Anaphylaxis, Rash and Hives    Allergy to enzyme injected in animals    Physical Exam General: Mildly obese middle-aged Caucasian lady, seated, in no evident distress Head: head normocephalic and atraumatic.   Neck: supple with no carotid or supraclavicular bruits Cardiovascular: regular rate and rhythm, no murmurs Musculoskeletal: no deformity Skin:  no rash/petichiae Vascular:  Normal pulses all extremities  Neurologic Exam Mental Status: Awake and fully alert. Oriented to place and time. Recent and remote memory intact. Attention span, concentration and fund of knowledge appropriate. Mood and affect appropriate.  Cranial Nerves: Fundoscopic exam reveals sharp disc margins. Pupils equal, briskly reactive to light. Extraocular movements full without nystagmus. Visual fields full to confrontation. Hearing intact. Facial sensation intact. Face, tongue, palate moves normally and symmetrically.  Motor: Normal bulk and tone. Normal strength in all tested extremity muscles.  Diminished fine finger movements on the right.  Mild right grip weakness. Sensory.: i diminished right face and hemibody touch pinprick sensation.  Intact position vibration  sensation. Coordination: Rapid alternating movements normal in all extremities. Finger-to-nose and heel-to-shin performed accurately bilaterally. Gait and Station: Arises from chair without difficulty. Stance is normal. Gait demonstrates normal stride length and balance . Able to heel, toe and tandem walk with slight difficulty.  Reflexes: 1+ and symmetric. Toes downgoing.   NIHSS  1 Modified Rankin  2   ASSESSMENT: 60 year old Caucasian lady with left thalamic lacunar infarct in November 2021 from small vessel disease.  Vascular risk factors of diabetes, hypertension, hyperlipidemia, marijuana and cocaine abuse and cerebrovascular disease.     PLAN: I had a long d/w patient about her recent stroke, risk for recurrent stroke/TIAs, personally independently reviewed imaging studies and stroke evaluation results and answered questions.Continue Plavix 75 mg daily for secondary stroke prevention and maintain strict control of hypertension with blood pressure goal below 130/90, diabetes with hemoglobin A1c goal below 6.5% and lipids with LDL cholesterol goal below 70 mg/dL. I also advised  the patient to eat a healthy diet with plenty of whole grains, cereals, fruits and vegetables, exercise regularly and maintain ideal body weight.  I advised the patient to quit smoking cigarettes, marijuana and using cocaine.  Check lipid profile and hemoglobin A1c today.  Followup in the future with my nurse practitioner Shanda Bumps in 3 months or call earlier if necessary.  Greater than 50% time during this 45-minute consultation visit was spent on counseling and coordination of care about her thalamic stroke and discussion about stroke prevention and treatment and answering questions. Delia Heady, MD Note: This document was prepared with digital dictation and possible smart phrase technology. Any transcriptional errors that result from this process are unintentional.

## 2020-09-25 LAB — HEMOGLOBIN A1C
Est. average glucose Bld gHb Est-mCnc: 243 mg/dL
Hgb A1c MFr Bld: 10.1 % — ABNORMAL HIGH (ref 4.8–5.6)

## 2020-09-25 LAB — LIPID PANEL
Chol/HDL Ratio: 3.7 ratio (ref 0.0–4.4)
Cholesterol, Total: 158 mg/dL (ref 100–199)
HDL: 43 mg/dL (ref >39)
LDL Chol Calc (NIH): 67 mg/dL (ref 0–99)
Triglycerides: 301 mg/dL — ABNORMAL HIGH (ref 0–149)
VLDL Cholesterol Cal: 48 mg/dL — ABNORMAL HIGH (ref 5–40)

## 2020-09-25 NOTE — Progress Notes (Signed)
Kindly inform the patient that cholesterol profile was satisfactory.  Screening test for diabetes was quite elevated and she needs to see her primary care physician for more aggressive diabetes control

## 2020-09-30 ENCOUNTER — Telehealth: Payer: Self-pay

## 2020-09-30 NOTE — Telephone Encounter (Signed)
I called patient.  No answer, left a voicemail, on cell, per DPR, advising her of her lab results and recommendations.  I asked patient to call back if she has any questions or concerns.

## 2020-09-30 NOTE — Telephone Encounter (Signed)
Pt returned phone call. Would like a call back. 

## 2020-09-30 NOTE — Telephone Encounter (Signed)
-----   Message from Micki Riley, MD sent at 09/25/2020  4:51 PM EST ----- Kindly inform the patient that cholesterol profile was satisfactory.  Screening test for diabetes was quite elevated and she needs to see her primary care physician for more aggressive diabetes control

## 2020-09-30 NOTE — Telephone Encounter (Signed)
I called patient to discuss.  No answer, left a voicemail asking her to call us back. 

## 2021-01-08 ENCOUNTER — Ambulatory Visit (INDEPENDENT_AMBULATORY_CARE_PROVIDER_SITE_OTHER): Payer: 59 | Admitting: Adult Health

## 2021-01-08 ENCOUNTER — Encounter: Payer: Self-pay | Admitting: Adult Health

## 2021-01-08 VITALS — BP 130/82 | HR 73 | Ht 61.0 in | Wt 174.0 lb

## 2021-01-08 DIAGNOSIS — Z9189 Other specified personal risk factors, not elsewhere classified: Secondary | ICD-10-CM

## 2021-01-08 DIAGNOSIS — F1910 Other psychoactive substance abuse, uncomplicated: Secondary | ICD-10-CM

## 2021-01-08 DIAGNOSIS — R202 Paresthesia of skin: Secondary | ICD-10-CM

## 2021-01-08 DIAGNOSIS — I639 Cerebral infarction, unspecified: Secondary | ICD-10-CM | POA: Diagnosis not present

## 2021-01-08 DIAGNOSIS — I6381 Other cerebral infarction due to occlusion or stenosis of small artery: Secondary | ICD-10-CM

## 2021-01-08 NOTE — Patient Instructions (Signed)
Please discuss increased anxiety concerns with Dr. Hart Robinsons next week  Continue clopidogrel 75 mg daily  and Crestor 20 mg daily for secondary stroke prevention  Continue to follow up with PCP regarding cholesterol, blood pressure and diabetes management  Maintain strict control of hypertension with blood pressure goal below 130/90, diabetes with hemoglobin A1c goal below 7 % and cholesterol with LDL cholesterol (bad cholesterol) goal below 70 mg/dL.   Referral placed to [facility] sleep clinic for evaluation of possible sleep apnea     Followup in the future with me in 6 months or call earlier if needed       Thank you for coming to see [place] at [facility]. I hope we have been able to provide you high quality care today.  You may receive a patient satisfaction survey over the next few weeks. We would appreciate your feedback and comments so that we may continue to improve ourselves and the health of our patients.   Sleep Apnea Sleep apnea affects breathing during sleep. It causes breathing to stop for 10 seconds or more, or to become shallow. People with sleep apnea usually snoreloudly. It can also increase the risk of: Heart attack. Stroke. Being very overweight (obese). Diabetes. Heart failure. Irregular heartbeat. High blood pressure. The goal of treatment is to help you breathe normally again. What are the causes?  The most common cause of this condition is a collapsed or blocked airway. There are three kinds of sleep apnea: Obstructive sleep apnea. This is caused by a blocked or collapsed airway. Central sleep apnea. This happens when the brain does not send the right signals to the muscles that control breathing. Mixed sleep apnea. This is a combination of obstructive and central sleep apnea. What increases the risk? Being overweight. Smoking. Having a small airway. Being older. Being female. Drinking alcohol. Taking medicines to calm yourself (sedatives  or tranquilizers). Having family members with the condition. Having a tongue or tonsils that are larger than normal. What are the signs or symptoms? Trouble staying asleep. Loud snoring. Headaches in the morning. Waking up gasping. Dry mouth or sore throat in the morning. Being sleepy or tired during the day. If you are sleepy or tired during the day, you may also: Not be able to focus your mind (concentrate). Forget things. Get angry a lot and have mood swings. Feel sad (depressed). Have changes in your personality. Have less interest in sex, if you are female. Be unable to have an erection, if you are female. How is this treated?  Sleeping on your side. Using a medicine to get rid of mucus in your nose (decongestant). Avoiding the use of alcohol, medicines to help you relax, or certain pain medicines (narcotics). Losing weight, if needed. Changing your diet. Quitting smoking. Using a machine to open your airway while you sleep, such as: An oral appliance. This is a mouthpiece that shifts your lower jaw forward. A CPAP device. This device blows air through a mask when you breathe out (exhale). An EPAP device. This has valves that you put in each nostril. A BPAP device. This device blows air through a mask when you breathe in (inhale) and breathe out. Having surgery if other treatments do not work. Follow these instructions at home: Lifestyle Make changes that your doctor recommends. Eat a healthy diet. Lose weight if needed. Avoid alcohol, medicines to help you relax, and some pain medicines. Do not smoke or use any products that contain nicotine or tobacco. If you  need help quitting, ask your doctor. General instructions Take over-the-counter and prescription medicines only as told by your doctor. If you were given a machine to use while you sleep, use it only as told by your doctor. If you are having surgery, make sure to tell your doctor you have sleep apnea. You may need  to bring your device with you. Keep all follow-up visits. Contact a doctor if: The machine that you were given to use during sleep bothers you or does not seem to be working. You do not get better. You get worse. Get help right away if: Your chest hurts. You have trouble breathing in enough air. You have an uncomfortable feeling in your back, arms, or stomach. You have trouble talking. One side of your body feels weak. A part of your face is hanging down. These symptoms may be an emergency. Get help right away. Call your local emergency services (911 in the [place] Do not wait to see if the symptoms will go away. Do not drive yourself to the hospital. Summary This condition affects breathing during sleep. The most common cause is a collapsed or blocked airway. The goal of treatment is to help you breathe normally while you sleep. This information is not intended to replace advice given to you by your health care provider. Make sure you discuss any questions you have with your healthcare provider. Document Revised: 06/13/2020 Document Reviewed: 06/13/2020 Elsevier Patient Education  2022 [employer].

## 2021-01-08 NOTE — Progress Notes (Signed)
[facility] [address] [place]. [place] [address] [phone]       OFFICE FOLLOW UP NOTE  [name] Date of Birth:  [birth year] Medical Record Number:  [id number]   Referring MD: Renaldo Harrison, DO  Reason for Referral: Stroke  Chief Complaint  Patient presents with   Follow-up    RM 14 alone Pt is well, has R hand complications and tingling in head     HPI:   Today, 01/08/2021, Kim Boyd returns for 71-month stroke follow-up.  Stable since prior visit without new stroke/TIA symptoms.  Reports residual right hand weakness and right sided numbness which has been stable.  She continues to live alone and maintains all ADLs and IADLs independently.  Compliant on Plavix and Crestor without associated side effects.  Blood pressure today 130/82.  Prior A1c 10.1 09/2020 -routinely monitors glucose levels at home which have been stable.  She does complain of excessive daytime fatigue with occasional insomnia as well as increased anxiety in the setting of her father recently passing away.  Unfortunately, she has continued tobacco use and occasional crack cocaine use (2x since prior visit).  EtOH use 1-2 mixed drinks per week.  No further concerns at this time.    History provided for reference purposes only Initial consult visit 09/24/2020 Dr. Pearlean Brownie: Kim Boyd is a 60 year old Caucasian lady seen today for initial office consultation visit for stroke.  History is obtained from the patient, review of electronic medical records and I personally reviewed pertinent imaging films in PACS.  She has past medical history of diabetes, hyperlipidemia, hypertension, tobacco and substance abuse.  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  She presented on 06/17/2020 with sudden onset of right-sided numbness weakness and decreased coordination.  She also had some trouble walking and stumbled  and was off balance.  She had some preceding nausea for 2 days and blurred vision and headache.  She had not been compliant with her medications.  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  MRI of the brain and neck both did not show large vessel stenosis or occlusion.  Echocardiogram showed 55 to 60% ejection fraction without cardiac source of embolism.  Hemoglobin A1c was elevated at 9.5 and LDL cholesterol of 175 mg percent.  Urine tox was positive for marijuana and cocaine.  Patient was started on dual antiplatelet therapy aspirin and Plavix for 3 weeks following which aspirin was stopped and he has stayed on Plavix alone.  She still has residual numbness in the right face arm and leg as well as slight dragging of her right leg and diminished fine motor skills in the hand but she is getting better.  She states she is more compliant with her medications.  She is on Plavix tolerating well without bruising or bleeding.  Her blood pressure is now better controlled and today it is 121/63.  Sugars continue to be elevated primary physician has recently increased the dose of Lantus.  She is tolerating Crestor well without any side effects.  She has finished home physical and occupational therapy.  She is living alone at home and is independent in activities of daily living but is not driving.  She has no new complaints.  ROS:   14 system review of systems is positive for those listed  in HPI all other systems negative  PMH:  Past Medical History:  Diagnosis Date   Diabetes mellitus without complication (HCC)    Hypertension    Stroke [facility])    April 2017    Social History:  Social History   Socioeconomic History   Marital status: Widowed    Spouse name: Not on file   Number of children: Not on file   Years of education: Not on file   Highest education level: Not on file   Occupational History   Occupation: disabled  Tobacco Use   Smoking status: Every Day    Packs/day: 0.50    Pack years: 0.00    Types: Cigarettes   Smokeless tobacco: Never  Substance and Sexual Activity   Alcohol use: Yes    Alcohol/week: 5.0 standard drinks    Types: 5 Shots of liquor per week    Comment: 5+ weekly   Drug use: Yes    Types: Marijuana   Sexual activity: Not on file  Other Topics Concern   Not on file  Social History Narrative   Lives alone   Left handed   Drinks 2-3 cups caffeine daily   Social Determinants of Health   Financial Resource Strain: Not on file  Food Insecurity: Not on file  Transportation Needs: Not on file  Physical Activity: Not on file  Stress: Not on file  Social Connections: Not on file  Intimate Partner Violence: Not on file    Medications:   Current Outpatient Medications on File Prior to Visit  Medication Sig Dispense Refill   amitriptyline (ELAVIL) 25 MG tablet Take 1 tablet by mouth daily.     clopidogrel (PLAVIX) 75 MG tablet Take 1 tablet (75 mg total) by mouth daily. 90 tablet 3   gabapentin (NEURONTIN) 300 MG capsule Take 300 mg by mouth 3 (three) times daily.     glipiZIDE (GLUCOTROL) 5 MG tablet Take 5 mg by mouth 2 (two) times daily.     hydrochlorothiazide (MICROZIDE) 12.5 MG capsule Take 12.5 mg by mouth daily.     LANTUS 100 UNIT/ML injection Inject 40 Units into the skin daily.   0   levocetirizine (XYZAL) 5 MG tablet Take 5 mg by mouth daily.     lisinopril (ZESTRIL) 20 MG tablet Take 20 mg by mouth daily.     rosuvastatin (CRESTOR) 20 MG tablet Take 1 tablet (20 mg total) by mouth daily. 90 tablet 3   No current facility-administered medications on file prior to visit.    Allergies:   Allergies  Allergen Reactions   Other Anaphylaxis, Rash and Hives    Allergy to enzyme injected in animals    Physical Exam Today's Vitals   01/08/21 1436  BP: 130/82  Pulse: 73  Weight: 174 lb (78.9 kg)  Height: 5\' 1"   (1.549 m)   Body mass index is 32.88 kg/m.   General: Mildly has not obese middle-aged Caucasian lady, seated, in no evident distress Head: head normocephalic and atraumatic.   Neck: supple with no carotid or supraclavicular bruits Cardiovascular: regular rate and rhythm, no murmurs Musculoskeletal: no deformity Skin:  no rash/petichiae Vascular:  Normal pulses all extremities  Neurologic Exam Mental Status: Awake and fully alert.  Fluent speech and language.  Oriented to place and time. Recent and remote memory intact. Attention span, concentration and fund of knowledge appropriate. Mood and affect appropriate.  Cranial Nerves: Pupils equal, briskly reactive to light. Extraocular movements full without nystagmus. Visual fields full to  confrontation. Hearing intact. Facial sensation intact. Face, tongue, palate moves normally and symmetrically.  Motor: Normal bulk and tone. Normal strength in all tested extremity muscles except diminished fine finger movements on the right.  Mild right grip weakness. Sensory.: diminished right face and hemibody light touch and pinprick sensation.  Intact position vibration sensation. Coordination: Rapid alternating movements normal in all extremities except slightly decreased right hand. Finger-to-nose and heel-to-shin performed accurately bilaterally. Gait and Station: Arises from chair without difficulty. Stance is normal. Gait demonstrates normal stride length and balance . Able to heel, toe and tandem walk with slight difficulty.  Reflexes: 1+ and symmetric. Toes downgoing.       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  Vascular risk factors of diabetes, hypertension, hyperlipidemia, marijuana and crack cocaine abuse and cerebrovascular disease.     PLAN: -continue Plavix 75 mg daily and Crestor for secondary stroke prevention   -Discussed secondary stroke prevention measures and importance of close PCP follow-up for aggressive stroke risk factor management: Maintain strict control of hypertension with blood pressure goal below 130/90, diabetes with hemoglobin A1c goal below 7% and lipids with LDL cholesterol goal below 70 mg/dL.  -Discussed importance of complete tobacco cessation as well as crack cocaine use as this greatly increases risk of additional strokes -she verbalized understanding but currently is not ready to quit -Referral placed to [facility] sleep clinic for possible sleep apnea -She was advised to discuss increased anxiety with PCP next week    Follow-up in 6 months or call earlier if needed   CC:  [facility] provider: Renaldo Harrison, DO   I spent 32 minutes of face-to-face and non-face-to-face time with patient.  This included previsit chart review, lab review, electronic health record documentation, patient education and discussion regarding history of prior stroke and residual deficits, secondary stroke prevention and importance of managing stroke risk factors as well as increased risk of stroke with continued polysubstance abuse, possible underlying sleep apnea and increased anxiety and answered all other questions to patient's satisfaction  Kim Boyd, AGNP-BC  [facility] [address] [place], [place] [address]  Phone [phone] Fax [phone] Note: This document was prepared with digital dictation and possible smart phrase technology. Any transcriptional errors that result from this process are unintentional.

## 2021-01-16 NOTE — Progress Notes (Signed)
I agree with the above plan 

## 2021-03-11 ENCOUNTER — Institutional Professional Consult (permissible substitution): Payer: 59 | Admitting: Neurology

## 2021-05-20 ENCOUNTER — Institutional Professional Consult (permissible substitution): Payer: 59 | Admitting: Neurology

## 2021-06-23 ENCOUNTER — Institutional Professional Consult (permissible substitution): Payer: 59 | Admitting: Neurology

## 2021-07-06 ENCOUNTER — Ambulatory Visit: Payer: 59 | Admitting: Adult Health

## 2021-08-07 ENCOUNTER — Institutional Professional Consult (permissible substitution): Payer: 59 | Admitting: Neurology

## 2021-10-06 ENCOUNTER — Ambulatory Visit (INDEPENDENT_AMBULATORY_CARE_PROVIDER_SITE_OTHER): Payer: 59 | Admitting: Neurology

## 2021-10-06 ENCOUNTER — Encounter: Payer: Self-pay | Admitting: Neurology

## 2021-10-06 VITALS — BP 136/77 | HR 77 | Ht 61.5 in | Wt 177.5 lb

## 2021-10-06 DIAGNOSIS — F1910 Other psychoactive substance abuse, uncomplicated: Secondary | ICD-10-CM

## 2021-10-06 DIAGNOSIS — R5383 Other fatigue: Secondary | ICD-10-CM | POA: Diagnosis not present

## 2021-10-06 DIAGNOSIS — I6381 Other cerebral infarction due to occlusion or stenosis of small artery: Secondary | ICD-10-CM | POA: Diagnosis not present

## 2021-10-06 DIAGNOSIS — H90A21 Sensorineural hearing loss, unilateral, right ear, with restricted hearing on the contralateral side: Secondary | ICD-10-CM

## 2021-10-06 DIAGNOSIS — F32A Depression, unspecified: Secondary | ICD-10-CM | POA: Diagnosis not present

## 2021-10-06 NOTE — Patient Instructions (Signed)
Please remember to try to maintain good sleep hygiene, which means: Keep a regular sleep and wake schedule, try not to exercise or have a meal within 2 hours of your bedtime, try to keep your bedroom conducive for sleep, that is, cool and dark, without light distractors such as an illuminated alarm clock, and refrain from watching TV right before sleep or in the middle of the night and do not keep the TV or radio on during the night. Also, try not to use or play on electronic devices at bedtime, such as your cell phone, tablet PC or laptop. If you like to read at bedtime on an electronic device, try to dim the background light as much as possible. Do not eat in the middle of the night.  ? ?We will request a sleep study.  ?  ?We will look for snoring or sleep apnea.  ? ?For chronic insomnia, you are best followed by a psychiatrist and/or sleep psychologist.  ? ?We will call you with the sleep study results and make a follow up appointment if needed.  ?Insomnia ?Insomnia is a sleep disorder that makes it difficult to fall asleep or stay asleep. Insomnia can cause fatigue, low energy, difficulty concentrating, mood swings, and poor performance at work or school. ?There are three different ways to classify insomnia: ?Difficulty falling asleep. ?Difficulty staying asleep. ?Waking up too early in the morning. ?Any type of insomnia can be long-term (chronic) or short-term (acute). Both are common. Short-term insomnia usually lasts for three months or less. Chronic insomnia occurs at least three times a week for longer than three months. ?What are the causes? ?Insomnia may be caused by another condition, situation, or substance, such as: ?Anxiety. ?Certain medicines. ?Gastroesophageal reflux disease (GERD) or other gastrointestinal conditions. ?Asthma or other breathing conditions. ?Restless legs syndrome, sleep apnea, or other sleep disorders. ?Chronic pain. ?Menopause. ?Stroke. ?Abuse of alcohol, tobacco, or illegal  drugs. ?Mental health conditions, such as depression. ?Caffeine. ?Neurological disorders, such as Alzheimer's disease. ?An overactive thyroid (hyperthyroidism). ?Sometimes, the cause of insomnia may not be known. ?What increases the risk? ?Risk factors for insomnia include: ?Gender. Women are affected more often than men. ?Age. Insomnia is more common as you get older. ?Stress. ?Lack of exercise. ?Irregular work schedule or working night shifts. ?Traveling between different time zones. ?Certain medical and mental health conditions. ?What are the signs or symptoms? ?If you have insomnia, the main symptom is having trouble falling asleep or having trouble staying asleep. This may lead to other symptoms, such as: ?Feeling fatigued or having low energy. ?Feeling nervous about going to sleep. ?Not feeling rested in the morning. ?Having trouble concentrating. ?Feeling irritable, anxious, or depressed. ?How is this diagnosed? ?This condition may be diagnosed based on: ?Your symptoms and medical history. Your health care provider may ask about: ?Your sleep habits. ?Any medical conditions you have. ?Your mental health. ?A physical exam. ?How is this treated? ?Treatment for insomnia depends on the cause. Treatment may focus on treating an underlying condition that is causing insomnia. Treatment may also include: ?Medicines to help you sleep. ?Counseling or therapy. ?Lifestyle adjustments to help you sleep better. ?Follow these instructions at home: ?Eating and drinking ? ?Limit or avoid alcohol, caffeinated beverages, and cigarettes, especially close to bedtime. These can disrupt your sleep. ?Do not eat a large meal or eat spicy foods right before bedtime. This can lead to digestive discomfort that can make it hard for you to sleep. ?Sleep habits ? ?Keep a sleep  diary to help you and your health care provider figure out what could be causing your insomnia. Write down: ?When you sleep. ?When you wake up during the night. ?How  well you sleep. ?How rested you feel the next day. ?Any side effects of medicines you are taking. ?What you eat and drink. ?Make your bedroom a dark, comfortable place where it is easy to fall asleep. ?Put up shades or blackout curtains to block light from outside. ?Use a white noise machine to block noise. ?Keep the temperature cool. ?Limit screen use before bedtime. This includes: ?Watching TV. ?Using your smartphone, tablet, or computer. ?Stick to a routine that includes going to bed and waking up at the same times every day and night. This can help you fall asleep faster. Consider making a quiet activity, such as reading, part of your nighttime routine. ?Try to avoid taking naps during the day so that you sleep better at night. ?Get out of bed if you are still awake after 15 minutes of trying to sleep. Keep the lights down, but try reading or doing a quiet activity. When you feel sleepy, go back to bed. ?General instructions ?Take over-the-counter and prescription medicines only as told by your health care provider. ?Exercise regularly, as told by your health care provider. Avoid exercise starting several hours before bedtime. ?Use relaxation techniques to manage stress. Ask your health care provider to suggest some techniques that may work well for you. These may include: ?Breathing exercises. ?Routines to release muscle tension. ?Visualizing peaceful scenes. ?Make sure that you drive carefully. Avoid driving if you feel very sleepy. ?Keep all follow-up visits as told by your health care provider. This is important. ?Contact a health care provider if: ?You are tired throughout the day. ?You have trouble in your daily routine due to sleepiness. ?You continue to have sleep problems, or your sleep problems get worse. ?Get help right away if: ?You have serious thoughts about hurting yourself or someone else. ?If you ever feel like you may hurt yourself or others, or have thoughts about taking your own life, get  help right away. You can go to your nearest emergency department or call: ?Your local emergency services (911 in the [place] ?A suicide crisis helpline, such as the National Suicide Prevention Lifeline at 403-855-22141-828-335-8592 or 988 in the [place] This is open 24 hours a day. ?Summary ?Insomnia is a sleep disorder that makes it difficult to fall asleep or stay asleep. ?Insomnia can be long-term (chronic) or short-term (acute). ?Treatment for insomnia depends on the cause. Treatment may focus on treating an underlying condition that is causing insomnia. ?Keep a sleep diary to help you and your health care provider figure out what could be causing your insomnia. ?This information is not intended to replace advice given to you by your health care provider. Make sure you discuss any questions you have with your health care provider. ?Document Revised: 01/28/2021 Document Reviewed: 05/15/2020 ?Elsevier Patient Education ? 2022 [employer] ?Quality Sleep Information, Adult ?Quality sleep is important for your mental and physical health. It also improves your quality of life. Quality sleep means you: ?Are asleep for most of the time you are in bed. ?Fall asleep within 30 minutes. ?Wake up no more than once a night.  ?Are awake for no longer than 20 minutes if you do wake up during the night. ?Most adults need 7-8 hours of quality sleep each night. ?How can poor sleep affect me? ?If you do not get enough quality sleep,  you may have: ?Mood swings. ?Daytime sleepiness. ?Confusion. ?Decreased reaction time. ?Sleep disorders, such as insomnia and sleep apnea. ?Difficulty with: ?Solving problems. ?Coping with stress. ?Paying attention. ?These issues may affect your performance and productivity at work, school, and at home. Lack of sleep may also put you at higher risk for accidents, suicide, and risky behaviors. ?If you do not get quality sleep you may also be at higher risk for several health problems, including: ?Infections. ?Type 2  diabetes. ?Heart disease. ?High blood pressure. ?Obesity. ?Worsening of long-term conditions, like arthritis, kidney disease, depression, Parkinson's disease, and epilepsy. ?What actions can I take to get more

## 2021-10-06 NOTE — Progress Notes (Signed)
? ? ?SLEEP MEDICINE CLINIC ?  ? ?Provider:  Melvyn Novas, MD  ?Primary Care Physician:  Renaldo Harrison, DO ?[address] ?[place] [address]  ? ?  ?Referring Provider: Micki Riley, Md ?[address] ?[address] ?[place],  [place] [address]  ?  ?  ?    ?Chief Complaint according to patient   ?Patient presents with:  ?  ? New Patient (Initial Visit)  ?   possible sleep apnea. Pt reports waking up 3-4x at night. Pt reports waking up hungry. Toss and turns all night. No prior Sleep evaluation.   ?  ?  ?HISTORY OF PRESENT ILLNESS:  ?Kim Boyd is a 62 y.o. Caucasian female  thalamic stroke patient of Dr. Pearlean Brownie 's and Ihor Austin, NP seen here upon request for a sleep consultation  on 10/06/2021 .  ?Chief concern according to patient :  " I have a lot of fatigue, I smoke, and I am hard of hearing"  ?  ?Kim Boyd presents with a history of chronic bronchitis, active tobacco smoking, obesity, hearing loss, Diabetes mellitus with insulin therapy and HTN. 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  ? ?Reason for Referral: Stroke ?HPI: Per Dr Pearlean Brownie: Ms. Talaga is a 61 year old Caucasian lady seen today for initial office consultation visit for stroke.  History is obtained from the patient, review of electronic medical records and I personally reviewed pertinent imaging films in PACS.  She has past medical history of diabetes, hyperlipidemia, hypertension, tobacco and substance abuse.  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  She presented on 06/17/2020 with sudden onset of right-sided numbness weakness and decreased coordination.  She also had some trouble walking and stumbled and was off balance.  She had some preceding nausea for 2 days and blurred vision and headache.  She had not been compliant with her medications.  MRI scan on admission  showed a acute left thalamic lacunar infarct and old right parieto-occipital as well as bilateral coronary lacunar infarcts of remote age.  MRI of the brain and neck both did not show large vessel stenosis or occlusion.  Echocardiogram showed 55 to 60% ejection fraction without cardiac source of embolism.  Hemoglobin A1c was elevated at 9.5 and LDL cholesterol of 175 mg percent.  Urine tox was positive for marijuana and cocaine.  Patient was started on dual antiplatelet therapy aspirin and Plavix for 3 weeks following which aspirin was stopped and he has stayed on Plavix alone.  She still has residual numbness in the right face arm and leg as well as slight dragging of her right leg and diminished fine motor skills in the hand but she is getting better.  She states she is more compliant with her medications.  She is on Plavix tolerating well without bruising or bleeding.  Her blood pressure is now better controlled and today it is 121/63.  Sugars continue to be elevated primary physician has recently increased the dose of Lantus.  She is tolerating Crestor well without any side effects.  She has finished home physical and occupational therapy.  She is living alone at home and is independent in activities of daily living but is not driving.  She has no new complaints. ?  ?  ?Sleep relevant medical history: Sleep walking in childhood ,No ENT surgery, allergic sinusitis  and rhinitis.   ? Family medical /sleep history: daughter Morrie Sheldonshley has OSA, untreated.  ?  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  and lives in a household alone. Family status is widowed, with adult children.  ?Pets are not  present. ?Tobacco use- ongoing , smoker of 1/2 ppd since age 61 .  ? ETOH use ; 2 a week,  ?Caffeine intake in form of Coffee( /) Soda( 1  day) Tea ( 5 a month ) no energy drinks. ?Regular exercise- none ( "my Blood pressure gets too high when I exercise").   ? ? ?  ?  ?Sleep habits are as  follows: The patient's dinner time is between 6-7 PM.  ?The patient goes to bed at 10 PM and is not asleep until 4 AM and then stays sometimes in bed for 24 hours- average sleep for 4-6 hours. ?no bathroom breaks, wakes up feeling hungry-  getting sometimes clammy, cold and needs to eat.  ?The preferred sleep position is variable , with the support of 6-8  pillows.  ?She needs to recline, elevating her head and chest- can't sleep flat- Dreams are reportedly rare.  ?There is no  routine rise time. The patient gets up when she feels like it, depending how late an how much she slept at night.  She wakes up only with an alarm.  ? ?There is neither a set routine and free shifting sleep time into the day.  ?She reports not feeling refreshed or restored in AM, ? Naps are taken frequently, lasting from 30 to 45 minutes and are not always refreshing.  ?  ?Review of Systems: ?Out of a complete 14 system review, the patient complains of only the following symptoms, and all other reviewed systems are negative.:  ? ?Facial numbness, allergic rhinitis, SOB, hoarseness , coughing, deafness. ?High level of Fatigue, denies excessive daytime sleepiness ,  ?unclear if snoring, no nocturia, poor sleep hygiene.  ? 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 ?  ?How likely are you to doze in the following situations: ?0 = not likely, 1 = slight chance, 2 = moderate chance, 3 = high chance ?  ?Sitting and Reading? ?Watching Television? ?Sitting inactive in a public place (theater or meeting)? ?As a passenger in a car for an hour without a break? ?Lying down in the afternoon when circumstances permit? ?Sitting and talking to someone? ?Sitting quietly after lunch without alcohol? ?In a car, while stopped for a few minutes in traffic? ?  ?Total = 4/ 24 points  ? FSS endorsed at 50/ 63 points.  ? ?Geriatric Depression Score 7/ 15 points - clinical depression.  ? ?Social History  ? ?Socioeconomic History  ? Marital status: Widowed   ?  Spouse name: Not on file  ? Number of children: 4  ? Years of education: Not on file  ? Highest education level: GED or equivalent  ?Occupational History  ? Occupation: disabled  ?Tobacco Use  ? Smoking status: Every Day  ?  Packs/day: 0.25  ?  Types: Cigarettes  ? Smokeless tobacco: Never  ?Substance and Sexual Activity  ? Alcohol use: Yes  ?  Alcohol/week: 1.0 standard drink  ?  Types: 1 Shots of liquor per week  ?  Comment: 1 weekly  ? Drug use: Not Currently  ?  Types: Marijuana  ? Sexual activity: Not on file  ?Other Topics Concern  ? Not on file  ?Social History  Narrative  ? Lives alone  ? Left handed  ? Caffeine: 1 soda a day  ? ?Social Determinants of Health  ? ?Financial Resource Strain: Not on file  ?Food Insecurity: Not on file  ?Transportation Needs: Not on file  ?Physical Activity: Not on file  ?Stress: Not on file  ?Social Connections: Not on file  ? ? ?Family History  ?Problem Relation Age of Onset  ? Thyroid disease Mother   ? Breast cancer Mother   ? High blood pressure Mother   ? ? ?Past Medical History:  ?Diagnosis Date  ? 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 Hypertension, poorly controlled    ? Stroke [facility]) thalamic, non -embolic CVA, left numbness, facial  dyseasthesias.    ? April 2017, November 2021,  ?Tobacco abuse ?Allergic rhinitis ?Hearing loss ?Obesity ?Unvaccinated ( Covid )  ? 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  ?  ? ? ?Past Surgical History:  ?Procedure Laterality Date  ? CESAREAN SECTION    ?  ? ?Current Outpatient Medications on File Prior to Visit  ?Medication Sig Dispense Refill  ? amitriptyline (ELAVIL) 25 MG tablet Take 1 tablet by mouth daily.    ? clopidogrel (PLAVIX) 75 MG tablet Take 1 tablet (75 mg total) by mouth daily. 90 tablet 3  ? FARXIGA 10 MG TABS tablet Take 10 mg by mouth daily.    ? gabapentin (NEURONTIN) 300 MG capsule Take 300 mg by mouth 3 (three) times daily.    ? glipiZIDE (GLUCOTROL)  5 MG tablet Take 5 mg by mouth 2 (two) times daily.    ? hydrochlorothiazide (MICROZIDE) 12.5 MG capsule Take 12.5 mg by mouth daily.    ? LANTUS 100 UNIT/ML injection Inject 40 Units into the skin d

## 2021-10-19 ENCOUNTER — Telehealth: Payer: Self-pay

## 2021-10-19 NOTE — Telephone Encounter (Signed)
LVM for pt to call me back to schedule sleep study  

## 2021-10-28 ENCOUNTER — Ambulatory Visit (INDEPENDENT_AMBULATORY_CARE_PROVIDER_SITE_OTHER): Payer: 59 | Admitting: Neurology

## 2021-10-28 DIAGNOSIS — H90A21 Sensorineural hearing loss, unilateral, right ear, with restricted hearing on the contralateral side: Secondary | ICD-10-CM

## 2021-10-28 DIAGNOSIS — F1910 Other psychoactive substance abuse, uncomplicated: Secondary | ICD-10-CM

## 2021-10-28 DIAGNOSIS — G4733 Obstructive sleep apnea (adult) (pediatric): Secondary | ICD-10-CM | POA: Diagnosis not present

## 2021-10-28 DIAGNOSIS — R5383 Other fatigue: Secondary | ICD-10-CM

## 2021-10-28 DIAGNOSIS — I6381 Other cerebral infarction due to occlusion or stenosis of small artery: Secondary | ICD-10-CM

## 2021-11-03 NOTE — Progress Notes (Signed)
? ? ? ? ?  ?  ?[facility] ?  ?HOME SLEEP TEST REPORT ( by Watch PAT)   ?STUDY DATE:  data loaded 11-03-2021  ?ORDERING CLINICIAN:  ?REFERRING CLINICIAN: Sethi,MD ?  ?CLINICAL INFORMATION/HISTORY: Kim Boyd is a 61 y.o. Caucasian female and thalamic stroke patient of Dr. Pearlean Brownie 's and Ihor Austin, NP seen here upon request for a sleep consultation on 10/06/2021 .  ?Chief concern according to patient :  " I have a lot of fatigue, I smoke, and I am hard of hearing"  ? she reports very fragmented sleep, being up 3-5 times each night, feeling hungry and eating in those breaks. She has poorly controlled diabetes and HTN, used cocaine and marijuana at the time of her stroke. ?[name] presents with a history of chronic bronchitis, active tobacco smoking, obesity, hearing loss, Diabetes mellitus with insulin therapy and HTN. 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   ? ? ?  ?Epworth sleepiness score: 4 /24. FSS at ?  ?BMI: 32 kg/m? ?  ?Neck Circumference: 15" ?  ?FINDINGS: ?  ?Sleep Summary: ?  ?Total Recording Time (hours, min):    9 hours and 12 minutes ?  ?Total Sleep Time (hours, min): 6 hours 5 minutes             ?  ?Percent REM (%):    13.8%                                  ?  ?Respiratory Indices: ?  ?Calculated pAHI (per hour):    7.3/h                       ?  ?REM pAHI: 9.5/h                                             ?  ?NREM pAHI:   6.9/h                         ?  ?Supine AHI:     AHI was 8.7/h the patient also slept 29.5 minutes on her left side with 0 AHI 185 minutes on her right side associated with an AHI of 7.2/h. ? ?Snoring level mean volume 41 dB, present for only 15% of total sleep time.                                            ?  ?Oxygen Saturation Statistics: ?   ?O2 Saturation Range (%): Between 84 and 97% with a mean saturation at 93%.                                    ?  ?O2 Saturation (minutes) <89%:    1.1-minute     ?  ?Pulse Rate Statistics:             ?  ?Pulse Range: Between 54 and 78 bpm with a mean  heart rate of 63 bpm.  Please note this home sleep test cannot give cardiac rhythm data only heart rate is reflected here.              ?  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 ?  ?RECOMMENDATION: Given that the patient does not report excessive daytime sleepiness and that her fragmented sleep pattern most likely reflects a steady state related to thalamic injury as well as to a history of substance abuse, I would not recommend CPAP treatment.  I doubt that this patient will gain better sleep quality or more consistent sleep from treatment of such mild apnea. ? ?  ?INTERPRETING PHYSICIAN: ? ? Melvyn Novas, MD  ? ?Medical Director of [facility].  ? ? ? ? ? ? ? ? ? ? ? ? ? ? ? ? ? ? ?

## 2021-11-06 NOTE — Progress Notes (Signed)
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 ?? ?RECOMMENDATION: Given that the patient does not report excessive daytime sleepiness and that her fragmented sleep pattern most likely reflects a steady state related to thalamic injury as well as to a history of substance abuse, I would not recommend CPAP treatment.  I doubt that this patient will gain better sleep quality or more consistent sleep from treatment of such mild apnea. ?? ?Patient should sleep on her left side.

## 2021-11-06 NOTE — Procedures (Signed)
?  ?  ?[facility] ?  ?HOME SLEEP TEST REPORT ( by Watch PAT)   ?STUDY DATE:  data loaded 11-03-2021  ?ORDERING CLINICIAN:  ?REFERRING CLINICIAN: Sethi,MD ?  ?CLINICAL INFORMATION/HISTORY: Kim Boyd is a 61 y.o. Caucasian female and thalamic stroke patient of Dr. Pearlean Brownie 's and Ihor Austin, NP seen here upon request for a sleep consultation on 10/06/2021 .  ?Chief concern according to patient :  " I have a lot of fatigue, I smoke, and I am hard of hearing"  ? she reports very fragmented sleep, being up 3-5 times each night, feeling hungry and eating in those breaks. She has poorly controlled diabetes and HTN, used cocaine and marijuana at the time of her stroke. ?[name] presents with a history of chronic bronchitis, active tobacco smoking, obesity, hearing loss, Diabetes mellitus with insulin therapy and HTN. 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   ? ? ?  ?Epworth sleepiness score: 4 /24. FSS at 15. ?  ?BMI: 32 kg/m? ?  ?Neck Circumference: 15" ?  ?FINDINGS: ?  ?Sleep Summary: ?  ?Total Recording Time (hours, min):    9 hours and 12 minutes ?  ?Total Sleep Time (hours, min): 6 hours 5 minutes             ?  ?Percent REM (%):    13.8%                                  ?  ?Respiratory Indices: ?  ?Calculated pAHI (per hour):    7.3/h                       ?  ?REM pAHI: 9.5/h                                             ?  ?NREM pAHI:   6.9/h                         ?  ?Supine AHI:     AHI was 8.7/h the patient also slept 29.5 minutes on her left side with 0 AHI 185 minutes on her right side associated with an AHI of 7.2/h. ? ?Snoring level mean volume 41 dB, present for only 15% of total sleep time.                                            ?  ?Oxygen Saturation Statistics: ?   ?O2 Saturation Range (%): Between 84 and 97% with a mean saturation at 93%.                                    ?  ?O2 Saturation (minutes) <89%:    1.1-minute     ?  ?Pulse Rate Statistics:             ?  ?Pulse Range: Between 54 and 78 bpm with a mean heart rate of  63 bpm.  Please note this home sleep test cannot give cardiac rhythm data only heart rate is reflected here.              ?  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 ?  ?RECOMMENDATION: Given that the patient does not report excessive daytime sleepiness and that her fragmented sleep pattern most likely reflects a steady state related to thalamic injury as well as to a history of substance abuse, I would not recommend CPAP treatment.  I doubt that this patient will gain better sleep quality or more consistent sleep from treatment of such mild apnea. ? ?  ?INTERPRETING PHYSICIAN: ? ? Melvyn Novas, MD  ? ?Medical Director of [facility].  ? ? ? ? ? ? ? ? ? ? ? ? ? ? ? ? ? ? ?

## 2021-11-09 ENCOUNTER — Telehealth: Payer: Self-pay | Admitting: Neurology

## 2021-11-09 NOTE — Telephone Encounter (Signed)
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 ?? ?RECOMMENDATION: Given that the patient does not report excessive daytime sleepiness and that her fragmented sleep pattern most likely reflects a steady state related to thalamic injury as well as to a history of substance abuse, I would not recommend CPAP treatment.  I doubt that this patient will gain better sleep quality or more consistent sleep from treatment of such mild apnea. ?? ?Patient should sleep on her left side. ?

## 2021-11-09 NOTE — Telephone Encounter (Signed)
Called and reviewed the sleep study results. Advised overall there was very mild sleep apnea and Dr Vickey Huger and she wouldn't feel there would be any additional information to add from a sleep standpoint. Recommended she sleep on left side. Pt verbalized understanding. ?Pt had no questions at this time but was encouraged to call back if questions arise. ? ?

## 2022-01-17 IMAGING — MR MR MRA HEAD W/O CM
1 series · 19 of 48 positions shown · IV contrast (gadavist)
Comparison: None.

CLINICAL DATA: Acute neurologic deficit

EXAM:
MR HEAD WITHOUT CONTRAST
MR CIRCLE OF WILLIS WITHOUT CONTRAST
MRA OF THE NECK WITHOUT AND WITH CONTRAST
TECHNIQUE: Multiplanar, multiecho pulse sequences of the brain, circle of
willis and surrounding structures were obtained without intravenous
contrast. Angiographic images of the neck were obtained using MRA
technique without and with intravenous contrast.
CONTRAST:  5.5mL GADAVIST GADOBUTROL 1 MMOL/ML IV SOLN

[Series 1: 3d cow · axial · 0.5mm · 0.41mm/px · z∈[-123,-42]mm · 19 of 172 slices shown]
[im 1/172]
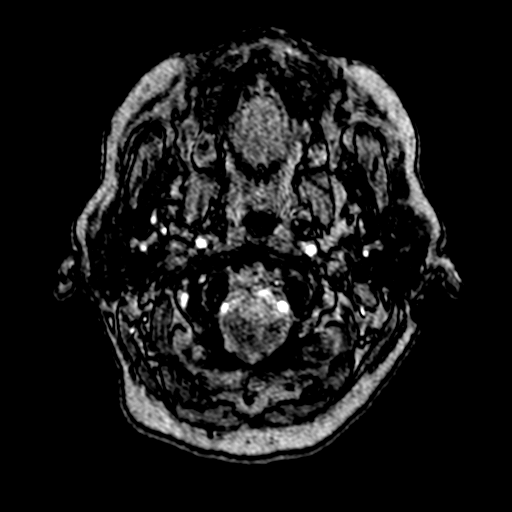
[im 4/172]
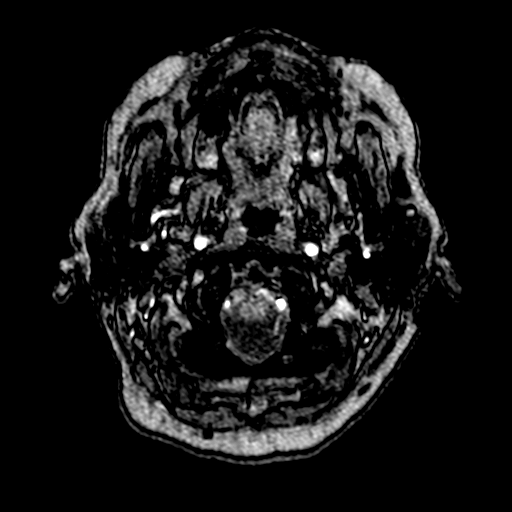
[im 8/172]
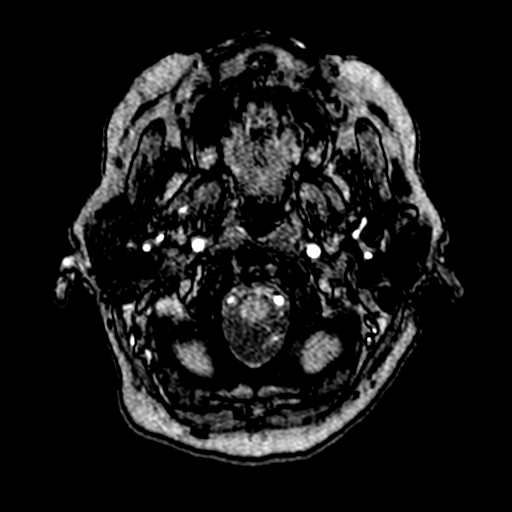
[im 11/172]
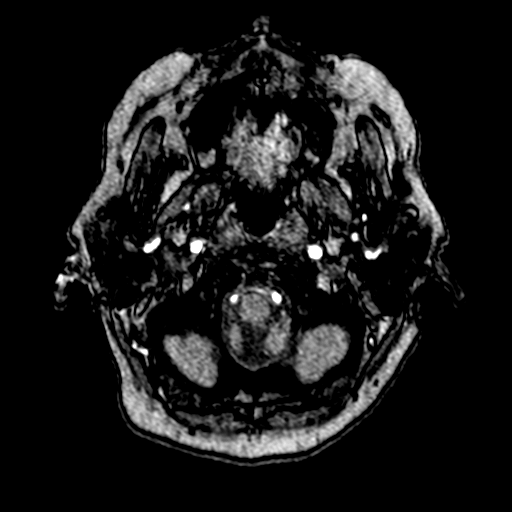
[im 15/172]
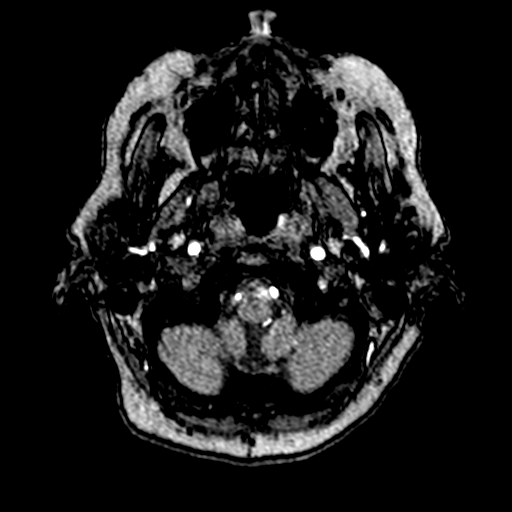
[im 19/172]
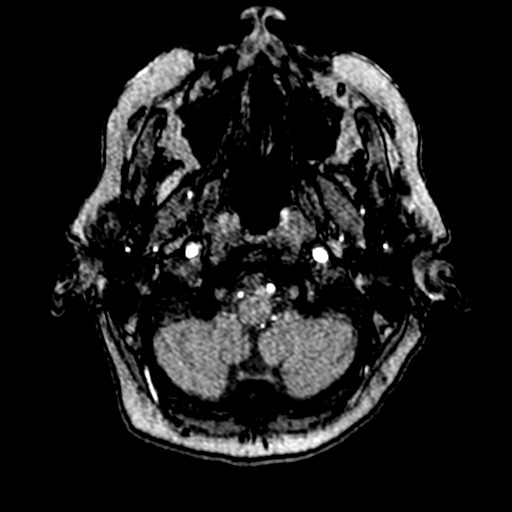
[im 22/172]
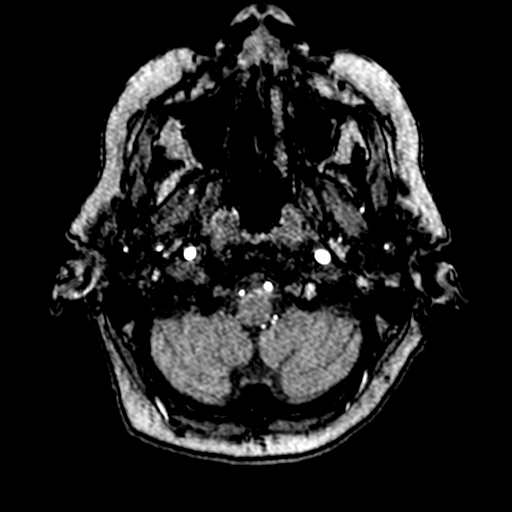
[im 26/172]
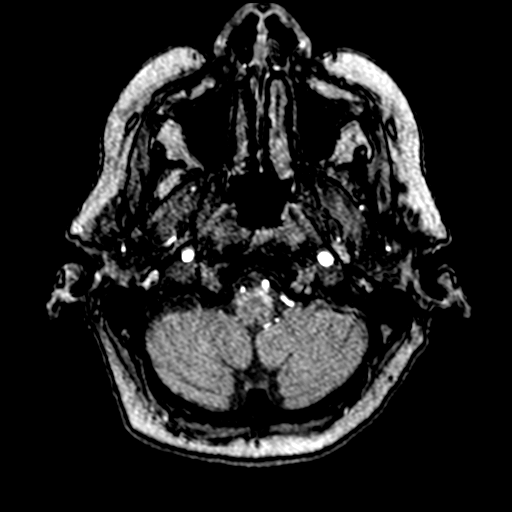
[im 30/172]
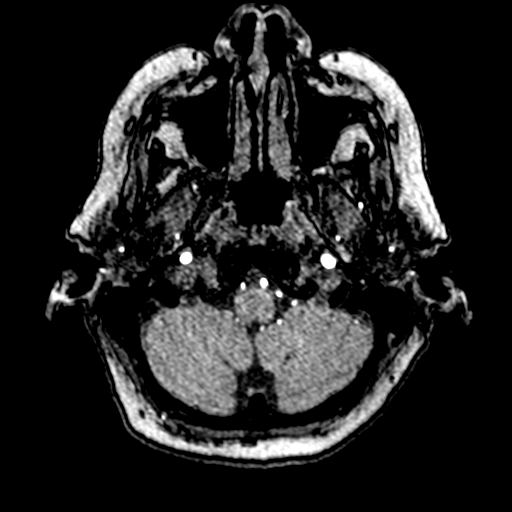
[im 33/172]
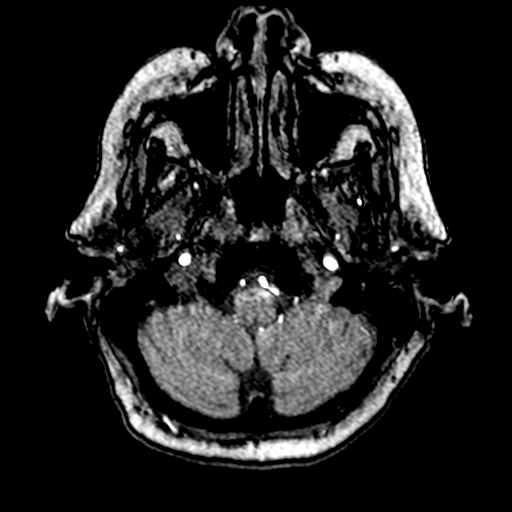
[im 37/172]
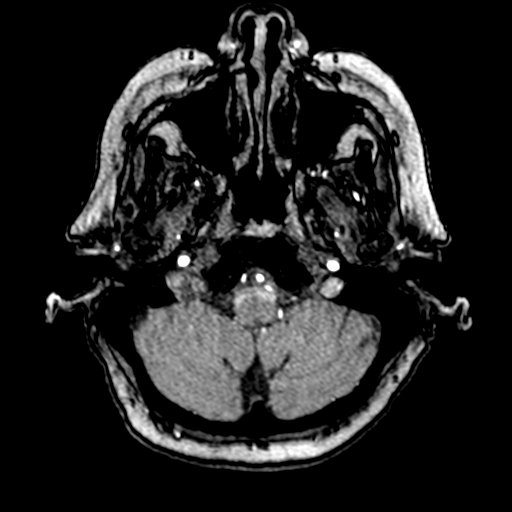
[im 55/172]
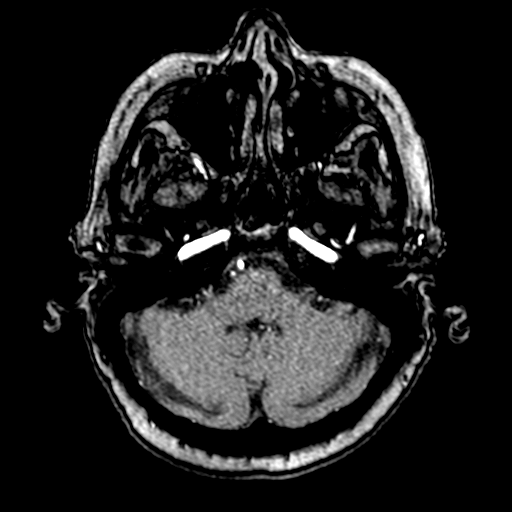
[im 77/172]
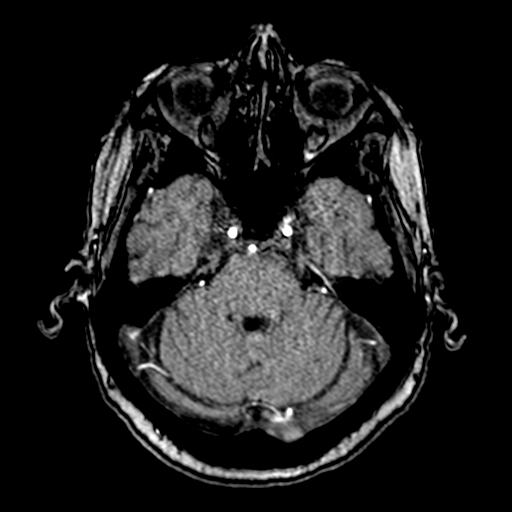
[im 88/172]
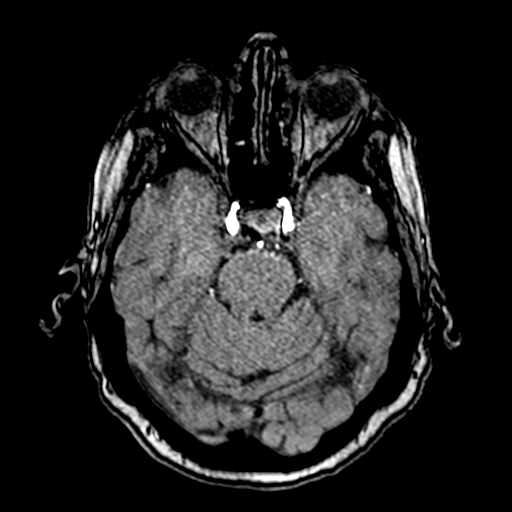
[im 99/172]
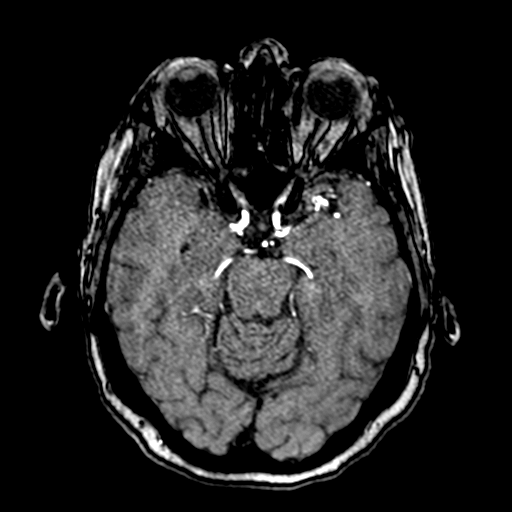
[im 121/172]
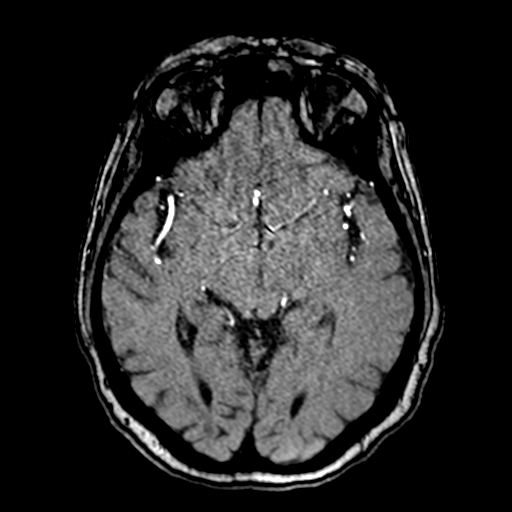
[im 142/172]
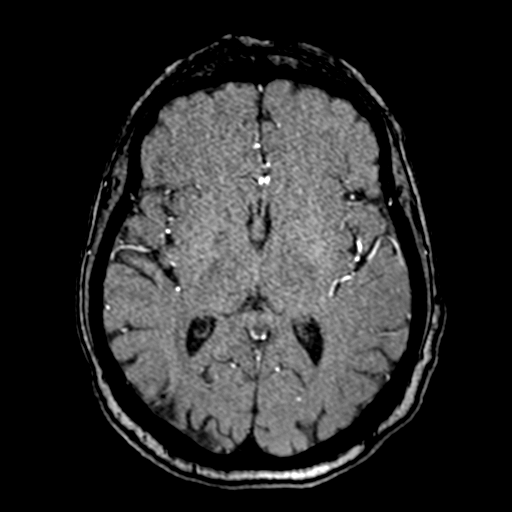
[im 146/172]
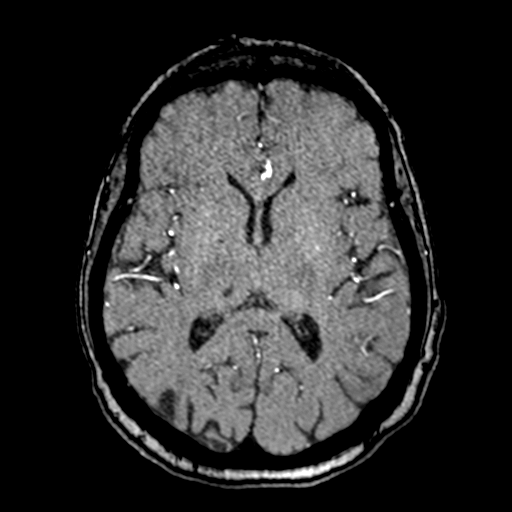
[im 164/172]
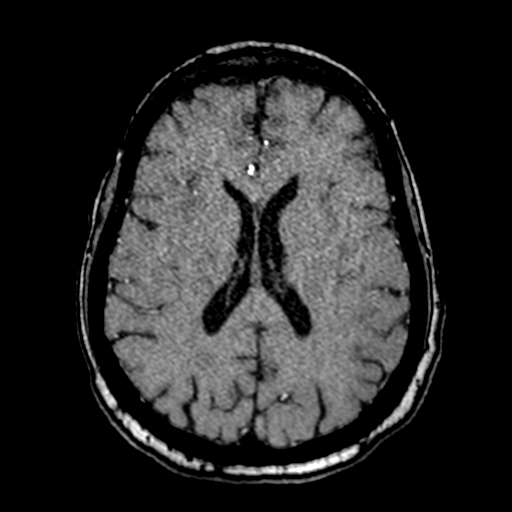

[19 of 48 positions shown; findings below may reference images not displayed]

FINDINGS: MR HEAD FINDINGS

Brain: Small focus of acute ischemia in the left thalamus.
Multifocal hyperintense T2-weighted signal within the white matter.
There is an old right occipital cortical infarct. Multiple old
corona radiata small vessel infarcts. Normal volume of CSF spaces.
No chronic microhemorrhage. Normal midline structures.

Vascular: Major flow voids are preserved.

Skull and upper cervical spine: Normal calvarium and skull base.
Visualized upper cervical spine and soft tissues are normal.

Sinuses/Orbits:No paranasal sinus fluid levels or advanced mucosal
thickening. No mastoid or middle ear effusion. Normal orbits.

MR CIRCLE OF WILLIS FINDINGS

POSTERIOR CIRCULATION:

--Vertebral arteries: Normal

--Inferior cerebellar arteries: Normal.

--Basilar artery: Normal.

--Superior cerebellar arteries: Normal.

--Posterior cerebral arteries: Normal.

ANTERIOR CIRCULATION:

--Intracranial internal carotid arteries: Normal.

--Anterior cerebral arteries (ACA): Normal.

--Middle cerebral arteries (MCA): Normal.

ANATOMIC VARIANTS: Fetal origin of the left PCA. Both P comm's are
present.

MRA NECK FINDINGS

There is an aberrant right subclavian artery. The visualized
subclavian arteries are both patent. There is no carotid artery
stenosis. The vertebral arteries are left-dominant. Both origins are
clearly patent. There is no stenosis or other abnormality.
IMPRESSION: 1. Small focus of acute ischemia in the left thalamus. No hemorrhage
or mass effect.
2. Old right occipital cortical infarct and multiple old corona
radiata small vessel infarcts.
3. Normal MRA of the head and neck.

## 2023-02-09 ENCOUNTER — Ambulatory Visit (INDEPENDENT_AMBULATORY_CARE_PROVIDER_SITE_OTHER): Payer: 59

## 2023-02-09 ENCOUNTER — Ambulatory Visit (HOSPITAL_COMMUNITY)
Admission: EM | Admit: 2023-02-09 | Discharge: 2023-02-09 | Disposition: A | Discharge status: Home / Self Care | Payer: 59 | Attending: Nurse Practitioner | Admitting: Nurse Practitioner

## 2023-02-09 ENCOUNTER — Encounter (HOSPITAL_COMMUNITY): Payer: Self-pay

## 2023-02-09 DIAGNOSIS — M545 Low back pain, unspecified: Secondary | ICD-10-CM

## 2023-02-09 DIAGNOSIS — G8929 Other chronic pain: Secondary | ICD-10-CM | POA: Diagnosis not present

## 2023-02-09 MED ORDER — KETOROLAC TROMETHAMINE 30 MG/ML IJ SOLN
15.0000 mg | Freq: Once | INTRAMUSCULAR | Status: AC
  Administered 2023-02-09: 15 mg via INTRAMUSCULAR

## 2023-02-09 MED ORDER — KETOROLAC TROMETHAMINE 30 MG/ML IJ SOLN
INTRAMUSCULAR | Status: AC
  Filled 2023-02-09: qty 1

## 2023-02-09 MED ORDER — TIZANIDINE HCL 4 MG PO TABS: 4.0000 mg | ORAL_TABLET | Freq: Three times a day (TID) | ORAL | 0 refills | Status: AC | PRN

## 2023-02-09 NOTE — Discharge Instructions (Signed)
The x-ray of your lumbar spine today does not show any acute findings.  We have given you a shot of Toradol today to help with pain.  Do not take any NSAIDs moving forward.  You can take Tylenol 500 to 1000 mg every 6 hours for pain.  You can also use the tizanidine every 8 hours as needed for severe pain.  Follow-up with primary care provider if symptoms persist or worsen despite treatment.

## 2023-02-09 NOTE — ED Triage Notes (Signed)
Patient here today with c/o LB pain after a fall 10 days ago. Patient has been having increased pain with weightbearing and has some weakness in her legs. Hurts to go from sitting to standing, getting out of bed, twisting, really any movement. Patient was in the yard and tripped on a branch and fell forward. She had a Lumbar fusion 5-6 years ago.

## 2023-02-09 NOTE — ED Provider Notes (Signed)
[facility]    CSN: [id number] Arrival date & time: 02/09/23  1349      History   Chief Complaint Chief Complaint  Patient presents with   Fall    HPI Kim Boyd is a 62 y.o. female.   Patient presents today with 10-day history of low back pain, worse on the right side.  Reports prior to pain starting, she was walking to her neighbors yard in the dark and fell over a twig.  Reports she fell forward onto her hands and knees.  She began having the pain in her right back shortly after.  No numbness or tingling going down the legs, but she endorses a little bit of weakness in bilateral lower extremities.  She has not fallen again since the initial fall.  No decreased sensation or new numbness/tingling in between the legs.  No new urinary or bowel incontinence.  No fevers or nausea/vomiting, no urinary symptoms since the pain began.  Has been taking different NSAIDs without relief.  Reports history of lumbar fusion years ago.    Past Medical History:  Diagnosis Date   Diabetes mellitus without complication (HCC)    Hypertension    Stroke [facility]    April 2017    Patient Active Problem List   Diagnosis Date Noted   Fatigue due to depression 10/06/2021   Sensorineural hearing loss (SNHL) of right ear with restricted hearing of left ear 10/06/2021   Labile blood glucose    Polysubstance abuse (HCC)    History of CVA with residual deficit    Arterial ischemic stroke, vertebrobasilar, thalamic, acute, left (HCC) 06/17/2020   Type 2 diabetes mellitus with diabetic polyneuropathy, with long-term current use of insulin (HCC) 06/17/2020   Essential hypertension 06/17/2020   Diabetic polyneuropathy associated with type 2 diabetes mellitus (HCC) 06/17/2020   Nicotine dependence, cigarettes, uncomplicated 06/17/2020   Acute CVA (cerebrovascular accident) (HCC) 06/17/2020    Past Surgical History:  Procedure Laterality Date   CESAREAN SECTION      OB History   No  obstetric history on file.      Home Medications    Prior to Admission medications   Medication Sig Start Date End Date Taking? Authorizing Provider  FARXIGA 10 MG TABS tablet Take 10 mg by mouth daily. 09/28/21  Yes [provider]  gabapentin (NEURONTIN) 300 MG capsule Take 300 mg by mouth 3 (three) times daily. 04/30/20  Yes [provider]  glipiZIDE (GLUCOTROL) 5 MG tablet Take 5 mg by mouth 2 (two) times daily. 05/22/20  Yes [provider]  hydrochlorothiazide (MICROZIDE) 12.5 MG capsule Take 12.5 mg by mouth daily. 05/07/20  Yes [provider]  LANTUS 100 UNIT/ML injection Inject 40 Units into the skin daily.  01/14/16  Yes [provider]  lisinopril (ZESTRIL) 20 MG tablet Take 20 mg by mouth daily. 05/07/20  Yes [provider]  rosuvastatin (CRESTOR) 20 MG tablet Take 1 tablet (20 mg total) by mouth daily. 06/20/20  Yes Lewie Chamber, MD  tiZANidine (ZANAFLEX) 4 MG tablet Take 1 tablet (4 mg total) by mouth every 8 (eight) hours as needed for muscle spasms. Do not take with alcohol or while driving or operating heavy machinery.  May cause drowsiness. 02/09/23  Yes Valentino Nose, NP  amitriptyline (ELAVIL) 25 MG tablet Take 1 tablet by mouth daily. 02/29/20   [provider]  clopidogrel (PLAVIX) 75 MG tablet Take 1 tablet (75 mg total) by mouth daily. 06/20/20  Lewie Chamber, MD  levocetirizine (XYZAL) 5 MG tablet Take 5 mg by mouth daily. 03/01/20   [provider]    Family History Family History  Problem Relation Age of Onset   Thyroid disease Mother    Breast cancer Mother    High blood pressure Mother     Social History Social History   Tobacco Use   Smoking status: Every Day    Current packs/day: 0.25    Types: Cigarettes   Smokeless tobacco: Never  Substance Use Topics   Alcohol use: Yes    Alcohol/week: 1.0 standard drink of alcohol    Types: 1 Shots of liquor per week    Comment: 1  weekly   Drug use: Not Currently    Types: Marijuana     Allergies   Other   Review of Systems Review of Systems Per HPI  Physical Exam Triage Vital Signs ED Triage Vitals  Encounter Vitals Group     BP 02/09/23 1426 (!) 163/84     Systolic BP Percentile --      Diastolic BP Percentile --      Pulse Rate 02/09/23 1426 71     Resp 02/09/23 1426 16     Temp 02/09/23 1426 97.7 F (36.5 C)     Temp Source 02/09/23 1426 Oral     SpO2 02/09/23 1426 97 %     Weight 02/09/23 1426 170 lb (77.1 kg)     Height 02/09/23 1426 5\' 1"  (1.549 m)     Head Circumference --      Peak Flow --      Pain Score 02/09/23 1422 10     Pain Loc --      Pain Education --      Exclude from Growth Chart --    No data found.  Updated Vital Signs BP (!) 163/84 (BP Location: Right Arm)   Pulse 71   Temp 97.7 F (36.5 C) (Oral)   Resp 16   Ht 5\' 1"  (1.549 m)   Wt 170 lb (77.1 kg)   SpO2 97%   BMI 32.12 kg/m   Visual Acuity Right Eye Distance:   Left Eye Distance:   Bilateral Distance:    Right Eye Near:   Left Eye Near:    Bilateral Near:     Physical Exam Vitals and nursing note reviewed.  Constitutional:      General: She is not in acute distress.    Appearance: Normal appearance. She is not toxic-appearing.  HENT:     Mouth/Throat:     Mouth: Mucous membranes are moist.     Pharynx: Oropharynx is clear.  Pulmonary:     Effort: Pulmonary effort is normal. No respiratory distress.  Musculoskeletal:       Back:     Comments: Patient exquisitely tender to palpation in approximately area marked.  No swelling, deformity, redness, bruising.  Patient has full strength and sensation to bilateral lower extremities.  Bilateral lower extremities are neurovascularly intact.  Skin:    General: Skin is warm and dry.     Capillary Refill: Capillary refill takes less than 2 seconds.     Coloration: Skin is not jaundiced or pale.     Findings: No erythema.  Neurological:     Mental  Status: She is alert and oriented to person, place, and time.  Psychiatric:        Behavior: Behavior is cooperative.      UC Treatments / Results  Labs (all labs ordered are listed, but only abnormal results are displayed) Labs Reviewed - No data to display  EKG   Radiology DG Lumbar Spine Complete  Result Date: 02/09/2023 CLINICAL DATA:  Low back pain after fall 2 weeks ago. EXAM: LUMBAR SPINE - COMPLETE 4+ VIEW COMPARISON:  February 25, 2016. FINDINGS: Status post surgical posterior fusion of L4-5 with bilateral intrapedicular screw placement and interbody fusion. Moderate superior endplate depression of L3 vertebral body is noted most consistent with old fracture. No acute fracture or spondylolisthesis is noted. IMPRESSION: Old L3 fracture is noted. Postsurgical changes as noted above. No acute abnormality seen in the lumbar spine. Aortic Atherosclerosis (ICD10-I70.0). Electronically Signed   By: Lupita Raider M.D.   On: 02/09/2023 16:02    Procedures Procedures (including critical care time)  Medications Ordered in UC Medications  ketorolac (TORADOL) 30 MG/ML injection 15 mg (15 mg Intramuscular Given 02/09/23 1513)    Initial Impression / Assessment and Plan / UC Course  I have reviewed the triage vital signs and the nursing notes.  Pertinent labs & imaging results that were available during my care of the patient were reviewed by me and considered in my medical decision making (see chart for details).   Patient is well-appearing, afebrile, not tachycardic, not tachypneic, oxygenating well on room air.  Patient is mildly hypertensive in urgent care today.  1. Chronic midline low back pain without sciatica Lumbar spine x-ray today is negative for acute findings No red flags in history or on exam today Toradol 15 mg IM given in urgent care today for pain control-patient has history of diabetes, heart disease, dose lowered Start Lidoderm patches, muscle relaxant as needed for  pain Recommend follow-up with PCP if symptoms persist or worsen despite treatment  The patient was given the opportunity to ask questions.  All questions answered to their satisfaction.  The patient is in agreement to this plan.   Final Clinical Impressions(s) / UC Diagnoses   Final diagnoses:  Chronic midline low back pain without sciatica     Discharge Instructions      The x-ray of your lumbar spine today does not show any acute findings.  We have given you a shot of Toradol today to help with pain.  Do not take any NSAIDs moving forward.  You can take Tylenol 500 to 1000 mg every 6 hours for pain.  You can also use the tizanidine every 8 hours as needed for severe pain.  Follow-up with primary care provider if symptoms persist or worsen despite treatment.     ED Prescriptions     Medication Sig Dispense Auth. Provider   tiZANidine (ZANAFLEX) 4 MG tablet Take 1 tablet (4 mg total) by mouth every 8 (eight) hours as needed for muscle spasms. Do not take with alcohol or while driving or operating heavy machinery.  May cause drowsiness. 30 tablet Valentino Nose, NP      PDMP not reviewed this encounter.   Valentino Nose, NP 02/09/23 [phone]

## 2023-02-16 ENCOUNTER — Other Ambulatory Visit: Payer: Self-pay | Admitting: Nurse Practitioner
